# Patient Record
Sex: Female | Born: 1970
Health system: Southern US, Community
[De-identification: ages and names within clinical notes are randomized; demographics above are authoritative.]

## PROBLEM LIST (undated history)

## (undated) DIAGNOSIS — I8290 Acute embolism and thrombosis of unspecified vein: Secondary | ICD-10-CM

## (undated) DIAGNOSIS — J189 Pneumonia, unspecified organism: Secondary | ICD-10-CM

## (undated) DIAGNOSIS — D649 Anemia, unspecified: Secondary | ICD-10-CM

---

## 2013-12-01 ENCOUNTER — Encounter (HOSPITAL_BASED_OUTPATIENT_CLINIC_OR_DEPARTMENT_OTHER): Payer: Self-pay | Admitting: Emergency Medicine

## 2013-12-01 ENCOUNTER — Emergency Department (HOSPITAL_BASED_OUTPATIENT_CLINIC_OR_DEPARTMENT_OTHER)
Admission: EM | Admit: 2013-12-01 | Discharge: 2013-12-01 | Disposition: A | Discharge status: Home / Self Care | Payer: Medicaid Other | Attending: Emergency Medicine | Admitting: Emergency Medicine

## 2013-12-01 ENCOUNTER — Emergency Department (HOSPITAL_BASED_OUTPATIENT_CLINIC_OR_DEPARTMENT_OTHER): Payer: Medicaid Other

## 2013-12-01 DIAGNOSIS — Y92410 Unspecified street and highway as the place of occurrence of the external cause: Secondary | ICD-10-CM | POA: Diagnosis not present

## 2013-12-01 DIAGNOSIS — S59912A Unspecified injury of left forearm, initial encounter: Secondary | ICD-10-CM | POA: Diagnosis not present

## 2013-12-01 DIAGNOSIS — Z862 Personal history of diseases of the blood and blood-forming organs and certain disorders involving the immune mechanism: Secondary | ICD-10-CM | POA: Insufficient documentation

## 2013-12-01 DIAGNOSIS — S46912A Strain of unspecified muscle, fascia and tendon at shoulder and upper arm level, left arm, initial encounter: Secondary | ICD-10-CM | POA: Diagnosis not present

## 2013-12-01 DIAGNOSIS — Y9389 Activity, other specified: Secondary | ICD-10-CM | POA: Diagnosis not present

## 2013-12-01 DIAGNOSIS — S199XXA Unspecified injury of neck, initial encounter: Secondary | ICD-10-CM | POA: Diagnosis present

## 2013-12-01 DIAGNOSIS — Z3202 Encounter for pregnancy test, result negative: Secondary | ICD-10-CM | POA: Insufficient documentation

## 2013-12-01 DIAGNOSIS — S161XXA Strain of muscle, fascia and tendon at neck level, initial encounter: Secondary | ICD-10-CM | POA: Diagnosis not present

## 2013-12-01 DIAGNOSIS — S299XXA Unspecified injury of thorax, initial encounter: Secondary | ICD-10-CM | POA: Diagnosis not present

## 2013-12-01 DIAGNOSIS — S6992XA Unspecified injury of left wrist, hand and finger(s), initial encounter: Secondary | ICD-10-CM | POA: Diagnosis not present

## 2013-12-01 HISTORY — DX: Anemia, unspecified: D64.9

## 2013-12-01 LAB — PREGNANCY, URINE: Preg Test, Ur: NEGATIVE

## 2013-12-01 MED ORDER — TRAMADOL HCL 50 MG PO TABS: 50.0000 mg | ORAL_TABLET | Freq: Four times a day (QID) | ORAL | Status: DC | PRN
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
## 2015-05-05 ENCOUNTER — Emergency Department (HOSPITAL_BASED_OUTPATIENT_CLINIC_OR_DEPARTMENT_OTHER)
Admission: EM | Admit: 2015-05-05 | Discharge: 2015-05-05 | Disposition: A | Discharge status: Home / Self Care | Payer: Medicaid Other | Attending: Emergency Medicine | Admitting: Emergency Medicine

## 2015-05-05 ENCOUNTER — Emergency Department (HOSPITAL_BASED_OUTPATIENT_CLINIC_OR_DEPARTMENT_OTHER): Payer: Medicaid Other

## 2015-05-05 ENCOUNTER — Encounter (HOSPITAL_BASED_OUTPATIENT_CLINIC_OR_DEPARTMENT_OTHER): Payer: Self-pay | Admitting: *Deleted

## 2015-05-05 DIAGNOSIS — Y998 Other external cause status: Secondary | ICD-10-CM | POA: Insufficient documentation

## 2015-05-05 DIAGNOSIS — Z86718 Personal history of other venous thrombosis and embolism: Secondary | ICD-10-CM | POA: Diagnosis not present

## 2015-05-05 DIAGNOSIS — Z862 Personal history of diseases of the blood and blood-forming organs and certain disorders involving the immune mechanism: Secondary | ICD-10-CM | POA: Insufficient documentation

## 2015-05-05 DIAGNOSIS — X58XXXA Exposure to other specified factors, initial encounter: Secondary | ICD-10-CM | POA: Insufficient documentation

## 2015-05-05 DIAGNOSIS — S6992XA Unspecified injury of left wrist, hand and finger(s), initial encounter: Secondary | ICD-10-CM | POA: Insufficient documentation

## 2015-05-05 DIAGNOSIS — Y9389 Activity, other specified: Secondary | ICD-10-CM | POA: Insufficient documentation

## 2015-05-05 DIAGNOSIS — M79604 Pain in right leg: Secondary | ICD-10-CM

## 2015-05-05 DIAGNOSIS — Y9289 Other specified places as the place of occurrence of the external cause: Secondary | ICD-10-CM | POA: Insufficient documentation

## 2015-05-05 DIAGNOSIS — M79645 Pain in left finger(s): Secondary | ICD-10-CM

## 2015-05-05 DIAGNOSIS — S8991XA Unspecified injury of right lower leg, initial encounter: Secondary | ICD-10-CM | POA: Diagnosis not present

## 2015-05-05 HISTORY — DX: Acute embolism and thrombosis of unspecified vein: I82.90

## 2015-05-05 MED ORDER — OXYCODONE-ACETAMINOPHEN 5-325 MG PO TABS: 1.0000 | ORAL_TABLET | Freq: Three times a day (TID) | ORAL | Status: DC | PRN

## 2015-05-05 MED ORDER — OXYCODONE-ACETAMINOPHEN 5-325 MG PO TABS
1.0000 | ORAL_TABLET | Freq: Once | ORAL | Status: AC
  Administered 2015-05-05: 1 via ORAL
  Filled 2015-05-05: qty 1

## 2015-05-05 NOTE — ED Notes (Signed)
NP at bedside.

## 2015-05-05 NOTE — Discharge Instructions (Signed)
Musculoskeletal Pain Musculoskeletal pain is muscle and boney aches and pains. These pains can occur in any part of the body. Your caregiver may treat you without knowing the cause of the pain. They may treat you if blood or urine tests, X-rays, and other tests were normal.  CAUSES There is often not a definite cause or reason for these pains. These pains may be caused by a type of germ (virus). The discomfort may also come from overuse. Overuse includes working out too hard when your body is not fit. Boney aches also come from weather changes. Bone is sensitive to atmospheric pressure changes. HOME CARE INSTRUCTIONS   Ask when your test results will be ready. Make sure you get your test results.  Only take over-the-counter or prescription medicines for pain, discomfort, or fever as directed by your caregiver. If you were given medications for your condition, do not drive, operate machinery or power tools, or sign legal documents for 24 hours. Do not drink alcohol. Do not take sleeping pills or other medications that may interfere with treatment.  Continue all activities unless the activities cause more pain. When the pain lessens, slowly resume normal activities. Gradually increase the intensity and duration of the activities or exercise.  During periods of severe pain, bed rest may be helpful. Lay or sit in any position that is comfortable.  Putting ice on the injured area.  Put ice in a bag.  Place a towel between your skin and the bag.  Leave the ice on for 15 to 20 minutes, 3 to 4 times a day.  Follow up with your caregiver for continued problems and no reason can be found for the pain. If the pain becomes worse or does not go away, it may be necessary to repeat tests or do additional testing. Your caregiver may need to look further for a possible cause. SEEK IMMEDIATE MEDICAL CARE IF:  You have pain that is getting worse and is not relieved by medications.  You develop chest pain  that is associated with shortness or breath, sweating, feeling sick to your stomach (nauseous), or throw up (vomit).  Your pain becomes localized to the abdomen.  You develop any new symptoms that seem different or that concern you. MAKE SURE YOU:   Understand these instructions.  Will watch your condition.  Will get help right away if you are not doing well or get worse.   This information is not intended to replace advice given to you by your health care provider. Make sure you discuss any questions you have with your health care provider.   Document Released: 01/25/2005 Document Revised: 04/19/2011 Document Reviewed: 09/29/2012 Elsevier Interactive Patient Education 2016 [employer] Thumb Sprain A thumb sprain is an injury to one of the strong bands of tissue (ligaments) that connect the bones in your thumb. The ligament can be stretched too much or it can tear. A tear can be either partial or complete. The severity of the sprain depends on how much of the ligament was damaged or torn. CAUSES A thumb sprain is often caused by a fall or an accident. If you extend your hands to catch an object or to protect yourself, the force of the impact can cause your ligament to stretch too much. This excess tension can also cause your ligament to tear. RISK FACTORS This injury is more likely to occur in people who play:  Sports that involve a greater risk of falling, such as skiing.  Sports that involve catching  an object, such as basketball. SYMPTOMS Symptoms of this condition include:  Loss of motion in your thumb.  Bruising.  Tenderness.  Swelling. DIAGNOSIS This condition is diagnosed with a medical history and physical exam. You may also have an X-ray of your thumb. TREATMENT Treatment varies depending on the severity of your sprain. If your ligament is overstretched or partially torn, treatment usually involves keeping your thumb in a fixed position (immobilization) for a  period of time. To help you do this, your health care provider will apply a bandage, cast, or splint to keep your thumb from moving until it heals. If your ligament is fully torn, you may need surgery to reconnect the ligament to the bone. After surgery, a cast or splint will be applied and will need to stay on your thumb while it heals. Your health care provider may also suggest exercises or physical therapy to strengthen your thumb. HOME CARE INSTRUCTIONS If You Have a Cast:  Do not stick anything inside the cast to scratch your skin. Doing that increases your risk of infection.  Check the skin around the cast every day. Report any concerns to your health care provider. You may put lotion on dry skin around the edges of the cast. Do not apply lotion to the skin underneath the cast.  Keep the cast clean and dry. If You Have a Splint:  Wear it as directed by your health care provider. Remove it only as directed by your health care provider.  Loosen the splint if your fingers become numb and tingle, or if they turn cold and blue.  Keep the splint clean and dry. Bathing  Cover the bandage, cast, or splint with a watertight plastic bag to protect it from water while you take a bath or a shower. Do not let the bandage, cast, or splint get wet. Managing Pain, Stiffness, and Swelling   If directed, apply ice to the injured area (unless you have a cast):  Put ice in a plastic bag.  Place a towel between your skin and the bag.  Leave the ice on for 20 minutes, 2-3 times per day.  Move your fingers often to avoid stiffness and to lessen swelling.  Raise (elevate) the injured area above the level of your heart while you are sitting or lying down. Driving  Do not drive or operate heavy machinery while taking pain medicine.  Do not drive while wearing a cast or splint on a hand that you use for driving. General Instructions  Do not put pressure on any part of your cast or splint until it  is fully hardened. This may take several hours.  Take medicines only as directed by your health care provider. These include over-the-counter medicines and prescription medicines.  Keep all follow-up visits as directed by your health care provider. This is important.  Do any exercise or physical therapy as directed by your health care provider.  Do not wear rings on your injured thumb. SEEK MEDICAL CARE IF:  Your pain is not controlled with medicine.  Your bruising or swelling gets worse.  Your cast or splint is damaged. SEEK IMMEDIATE MEDICAL CARE IF:  Your thumb is numb or blue.  Your thumb feels colder than normal.   This information is not intended to replace advice given to you by your health care provider. Make sure you discuss any questions you have with your health care provider.   Document Released: 03/04/2004 Document Revised: 06/11/2014 Document Reviewed: 11/06/2013 Elsevier Interactive Patient  Education ©2016 Elsevier Inc. ° °

## 2015-05-05 NOTE — ED Notes (Signed)
Pain in her right leg and left lower arm since yesterday. States she stopped taking Coumadin 4 months ago for hx of blood clots.

## 2015-05-05 NOTE — ED Notes (Signed)
PA advised pt c/o pain.
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
## 2015-08-08 ENCOUNTER — Emergency Department (HOSPITAL_BASED_OUTPATIENT_CLINIC_OR_DEPARTMENT_OTHER)
Admission: EM | Admit: 2015-08-08 | Discharge: 2015-08-08 | Disposition: A | Discharge status: Home / Self Care | Payer: Medicaid Other | Attending: Emergency Medicine | Admitting: Emergency Medicine

## 2015-08-08 ENCOUNTER — Encounter (HOSPITAL_BASED_OUTPATIENT_CLINIC_OR_DEPARTMENT_OTHER): Payer: Self-pay | Admitting: Emergency Medicine

## 2015-08-08 DIAGNOSIS — K0889 Other specified disorders of teeth and supporting structures: Secondary | ICD-10-CM | POA: Insufficient documentation

## 2015-08-08 DIAGNOSIS — Z79899 Other long term (current) drug therapy: Secondary | ICD-10-CM | POA: Diagnosis not present

## 2015-08-08 MED ORDER — IBUPROFEN 800 MG PO TABS: 800.0000 mg | ORAL_TABLET | Freq: Three times a day (TID) | ORAL | Status: DC | PRN

## 2015-08-08 MED ORDER — CLINDAMYCIN HCL 150 MG PO CAPS: 150.0000 mg | ORAL_CAPSULE | Freq: Four times a day (QID) | ORAL | Status: DC

## 2015-08-08 MED FILL — CLINDAMYCIN HCL 150 MG CAP: 150 | 7 days supply | Qty: 28 | Fill #0

## 2015-08-08 MED FILL — IBUPROFEN 800 MG TABLET: 800 | 10 days supply | Qty: 30 | Fill #0

## 2015-08-08 NOTE — ED Provider Notes (Signed)
CSN: [id number]     Arrival date & time 08/08/15  [id number] History   First MD Initiated Contact with Patient 08/08/15 1013     Chief Complaint  Patient presents with  . Dental Pain     (Consider location/radiation/quality/duration/timing/severity/associated sxs/prior Treatment) HPI   45 year old female presenting with complaint of dental pain. Patient report she had her right upper molar tooth removed recently by a dentist. Patient states they may have cracked the adjacent tooth and since the dental extraction she has been having persistent sharp throbbing pain to her right upper gum as well as pain along her right lower jaw. She expressed the concern of pain to her dentist and was told that "nothing is wrong" she went to a different dentist and was told that she will need to have her "gum shaved". Patient refused to have the procedure done and decided to come to the ER for further evaluation. Patient states that in the past in 2006 she has a bad dental infection "caused by the dentist" and had to be hospitalized therefore she has a strong distrust of dentist. Currently patient denies having fever, trouble swallowing, neck pain. She was prescribed oxycodone 5 days ago by dentist but states that it did not provide any adequate relief of her pain. She is also concerned for infection.  Past Medical History  Diagnosis Date  . Anemia   . Blood clot in vein    Past Surgical History  Procedure Laterality Date  . Cesarean section     No family history on file. Social History  Substance Use Topics  . Smoking status: Never Smoker   . Smokeless tobacco: None  . Alcohol Use: No   OB History    No data available     Review of Systems  All other systems reviewed and are negative.     Allergies  Penicillins and Sulfa antibiotics  Home Medications   Prior to Admission medications   Medication Sig Start Date End Date Taking? Authorizing Provider  oxyCODONE-acetaminophen (PERCOCET) 5-325 MG  tablet Take 1 tablet by mouth every 8 (eight) hours as needed for severe pain. 05/05/15  Yes Felicie Mornavid Smith, NP  traMADol (ULTRAM) 50 MG tablet Take 1 tablet (50 mg total) by mouth every 6 (six) hours as needed. 12/01/13   Geoffery Lyonsouglas Delo, MD   BP 134/81 mmHg  Pulse 71  Temp(Src) 99.1 F (37.3 C) (Oral)  Resp 18  Ht 5\' 1"  (1.549 m)  Wt 84.823 kg  BMI 35.35 kg/m2  SpO2 100%  LMP 07/08/2015 Physical Exam  Constitutional: She appears well-developed. No distress.  HENT:  Mouth: Tenderness noted to right upper gum adjacent to first molar (tooth #1) that was previously extracted. Some evidence of growth noted to the gum area with questionable retained fragment of teeth in the affected region. Tenderness to palpation. No obvious abscess appreciated. Mild gingival erythema.  Neck: Normal range of motion.  Cardiovascular: Normal rate and regular rhythm.   Pulmonary/Chest: Effort normal and breath sounds normal.  Lymphadenopathy:    She has no cervical adenopathy.  Neurological: She is alert.  Skin: Skin is warm.  Psychiatric: She has a normal mood and affect.  Nursing note and vitals reviewed.   ED Course  Procedures (including critical care time)   MDM   Final diagnoses:  Pain, dental    BP 134/81 mmHg  Pulse 71  Temp(Src) 99.1 F (37.3 C) (Oral)  Resp 18  Ht 5\' 1"  (1.549 m)  Wt 84.823 kg  BMI 35.35 kg/m2  SpO2 100%  LMP 07/08/2015   10:19 AM Pt here with dental pain.  She was seen by Dr. Gay FillerStefan DDS 5 days ago, and was prescribed 20 vicodin according [facility].    Pt has pain to the gum line.  No obvious abscess.  Will provide abx, NSAIDs and dental referral.  Low suspicion for deep space infection.    Fayrene HelperBowie Jennavieve Arrick, PA-C 08/08/15 1036  Laurence Spatesachel Morgan Little, MD 08/08/15 [phone]

## 2015-08-08 NOTE — ED Notes (Signed)
R upper broken tooth causing pain x 1 week. Pt dentist prescribed hydrocodone and pt states pain persists.

## 2015-08-08 NOTE — Discharge Instructions (Signed)
Dental Pain °Dental pain may be caused by many things, including: °· Tooth decay (cavities or caries). Cavities cause the nerve of your tooth to be open to air and hot or cold temperatures. This can cause pain or discomfort. °· Abscess or infection. A dental abscess is an area that is full of infected pus from a bacterial infection in the inner part of the tooth (pulp). It usually happens at the end of the tooth's root. °· Injury. °· An unknown reason (idiopathic). °Your pain may be mild or severe. It may only happen when: °· You are chewing. °· You are exposed to hot or cold temperature. °· You are eating or drinking sugary foods or beverages, such as: °¨ Soda. °¨ Candy. °Your pain may also be there all of the time. °HOME CARE °Watch your dental pain for any changes. Do these things to lessen your discomfort: °· Take medicines only as told by your dentist. °· If your dentist tells you to take an antibiotic medicine, finish all of it even if you start to feel better. °· Keep all follow-up visits as told by your dentist. This is important. °· Do not apply heat to the outside of your face. °· Rinse your mouth or gargle with salt water if told by your dentist. This helps with pain and swelling. °¨ You can make salt water by adding ¼ tsp of salt to 1 cup of warm water. °· Apply ice to the painful area of your face: °¨ Put ice in a plastic bag. °¨ Place a towel between your skin and the bag. °¨ Leave the ice on for 20 minutes, 2-3 times per day. °· Avoid foods or drinks that cause you pain, such as: °¨ Very hot or very cold foods or drinks. °¨ Sweet or sugary foods or drinks. °GET HELP IF: °· Your pain is not helped with medicines. °· Your symptoms are worse. °· You have new symptoms. °GET HELP RIGHT AWAY IF: °· You cannot open your mouth. °· You are having trouble breathing or swallowing. °· You have a fever. °· Your face, neck, or jaw is puffy (swollen). °  °This information is not intended to replace advice given to  you by your health care provider. Make sure you discuss any questions you have with your health care provider. °  °Document Released: 07/14/2007 Document Revised: 06/11/2014 Document Reviewed: 01/21/2014 °Elsevier Interactive Patient Education ©2016 Elsevier Inc. ° ° ° °Community Resource Guide Dental °The United Way’s “211” is a great source of information about community services available.  Access by dialing 2-1-1 from anywhere in [place], or by website -  www.nc211.org.  ° °Other Local Resources (Updated 02/2015) ° °Dental  Care °  °Services ° °  °Phone Number and Address  °Cost  °[facility] For children 0 - 21 years of age:  °• Cleaning °• Tooth brushing/flossing instruction °• Sealants, fillings, crowns °• Extractions °• Emergency treatment  [phone] °[address] °[place], [place] [address] Charges based on family income.  Medicaid and some insurance plans accepted.   °  °[facility] • Cleaning °• Sealants, fillings, crowns °• Extractions °• Emergency treatment [phone] °[address] °[place], [place] ° Pregnant women 18 years of age or older with a Medicaid card  °[facility] [place] • Cleaning °• Sealants, fillings, crowns °• Extractions °• Emergency treatment [phone] °[address] °[place],  Pregnant women 18   years of age or older with a Medicaid card  °[facility] For children 0 - 21 years of age:  °• Cleaning °• Tooth brushing/flossing instruction °• Sealants, fillings, crowns °• Extractions °• Emergency treatment °Limited orthodontic services for patients with Medicaid [phone] °[address] °[place], [place] [address] Medicaid and [place] Health Choice cover for children up to age 21 and pregnant women.  Parents of children up to age 21 without Medicaid pay a reduced fee at time of service.   °[facility] [place] For children 0 - 21 years of age:  °• Cleaning °• Tooth brushing/flossing instruction °• Sealants, fillings, crowns °• Extractions °• Emergency treatment °Limited orthodontic services for patients with Medicaid [phone] °[address] °[place], [place].  Medicaid and [place] Health Choice cover for children up to age 21 and pregnant women.  Parents of children up to age 21 without Medicaid pay a reduced fee.  °[facility] • Cleaning °• Sealants, fillings, crowns °• Extractions ° °Hours: Tuesdays and Thursdays, 4:15 - 8 pm [phone] °[address] °[place], [place] [address] Services free of charge to [place] residents ages 18-64 who do not have health insurance, Medicare, Medicaid, or VA benefits and fall within federal poverty guidelines  °[facility] ° ° ° Provides dental care in addition to primary medical care, nutritional counseling, and pharmacy: °• Cleaning °• Sealants, fillings, crowns °• Extractions ° ° ° ° ° ° ° ° ° ° ° ° ° ° ° ° ° [phone] °[facility], [address] °[place], [place] ° °[phone] °[facility], [address] [place], [place] ° °[phone] °[facility] °[place], [place] ° °[phone] °[facility], [address] °[place], [place] ° °[phone] °[facility] °[address] °[place], [place] Accepts Medicaid, Medicare, most insurance.  Also provides services available to all with fees adjusted based on ability to pay.    °[facility] • Cleaning °• Tooth brushing/flossing instruction °• Sealants, fillings, crowns °• Extractions °• Emergency treatment °Hours: Tuesdays, Thursdays, and Fridays from 8 am to 5 pm by appointment only. [phone] °[address] °[place], [place] [address] [place] residents  with Medicaid (depending on eligibility) and children with [place] Health Choice - call for more information.  °[facility] • Extractions only ° °Hours: 2nd and 4th Thursday of each month from 6:30 am - 9 am.   [phone] ext. [phone] °[address] °[place], [place] [address] Ages 18 and older only.  Patients are seen on a first come, first served basis.  °[facility] • Cleanings °• Fillings °• Extractions °• Orthodontics °• Endodontics °• Implants/Crowns/Bridges °• Complete and partial dentures [phone] °[place],  Patients must complete an application for services.  There is often a waiting list.   ° °

## 2015-11-26 ENCOUNTER — Encounter (HOSPITAL_BASED_OUTPATIENT_CLINIC_OR_DEPARTMENT_OTHER): Payer: Self-pay | Admitting: *Deleted

## 2015-11-26 ENCOUNTER — Emergency Department (HOSPITAL_BASED_OUTPATIENT_CLINIC_OR_DEPARTMENT_OTHER): Payer: Medicaid Other

## 2015-11-26 ENCOUNTER — Emergency Department (HOSPITAL_BASED_OUTPATIENT_CLINIC_OR_DEPARTMENT_OTHER)
Admission: EM | Admit: 2015-11-26 | Discharge: 2015-11-26 | Disposition: A | Discharge status: Home / Self Care | Payer: Medicaid Other | Attending: Emergency Medicine | Admitting: Emergency Medicine

## 2015-11-26 DIAGNOSIS — M654 Radial styloid tenosynovitis [de Quervain]: Secondary | ICD-10-CM | POA: Diagnosis not present

## 2015-11-26 DIAGNOSIS — M778 Other enthesopathies, not elsewhere classified: Secondary | ICD-10-CM

## 2015-11-26 DIAGNOSIS — M779 Enthesopathy, unspecified: Secondary | ICD-10-CM

## 2015-11-26 DIAGNOSIS — M79642 Pain in left hand: Secondary | ICD-10-CM | POA: Diagnosis present

## 2015-11-26 MED ORDER — MELOXICAM 15 MG PO TABS: 15.0000 mg | ORAL_TABLET | Freq: Every day | ORAL | 0 refills | Status: DC

## 2015-11-26 MED FILL — MELOXICAM 15 MG TABLET: 15 | 30 days supply | Qty: 30 | Fill #0

## 2015-11-26 NOTE — Discharge Instructions (Signed)
Wear splint at work.  Mobic daily.  Ice to sore area for 20 minutes after work.
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
## 2015-11-26 NOTE — ED Notes (Signed)
Patient is alert and oriented x3.  She was given DC instructions and follow up visit instructions.  Patient gave verbal understanding. She was DC ambulatory under her own power to home.  V/S stable.  He was not showing any signs of distress on DC 

## 2015-11-26 NOTE — ED Notes (Signed)
Given d/c instructions as per chart. Verbalizes understanding. No questions. Rx x 1 

## 2015-11-26 NOTE — ED Triage Notes (Signed)
Pt c/o left hand /wrist pain with out injury

## 2016-01-05 ENCOUNTER — Other Ambulatory Visit (HOSPITAL_BASED_OUTPATIENT_CLINIC_OR_DEPARTMENT_OTHER): Payer: Self-pay | Admitting: Emergency Medicine

## 2016-01-05 DIAGNOSIS — R609 Edema, unspecified: Secondary | ICD-10-CM

## 2016-01-06 ENCOUNTER — Ambulatory Visit (HOSPITAL_BASED_OUTPATIENT_CLINIC_OR_DEPARTMENT_OTHER): Admission: RE | Admit: 2016-01-06 | Payer: Medicaid Other | Source: Ambulatory Visit

## 2016-04-08 ENCOUNTER — Emergency Department (HOSPITAL_BASED_OUTPATIENT_CLINIC_OR_DEPARTMENT_OTHER)
Admission: EM | Admit: 2016-04-08 | Discharge: 2016-04-08 | Disposition: A | Discharge status: Home / Self Care | Payer: Medicaid Other | Attending: Emergency Medicine | Admitting: Emergency Medicine

## 2016-04-08 ENCOUNTER — Emergency Department (HOSPITAL_BASED_OUTPATIENT_CLINIC_OR_DEPARTMENT_OTHER): Payer: Medicaid Other

## 2016-04-08 ENCOUNTER — Encounter (HOSPITAL_BASED_OUTPATIENT_CLINIC_OR_DEPARTMENT_OTHER): Payer: Self-pay | Admitting: Emergency Medicine

## 2016-04-08 DIAGNOSIS — W172XXA Fall into hole, initial encounter: Secondary | ICD-10-CM | POA: Diagnosis not present

## 2016-04-08 DIAGNOSIS — Y92009 Unspecified place in unspecified non-institutional (private) residence as the place of occurrence of the external cause: Secondary | ICD-10-CM | POA: Diagnosis not present

## 2016-04-08 DIAGNOSIS — Y939 Activity, unspecified: Secondary | ICD-10-CM | POA: Diagnosis not present

## 2016-04-08 DIAGNOSIS — Z3A09 9 weeks gestation of pregnancy: Secondary | ICD-10-CM | POA: Insufficient documentation

## 2016-04-08 DIAGNOSIS — S7002XA Contusion of left hip, initial encounter: Secondary | ICD-10-CM | POA: Diagnosis not present

## 2016-04-08 DIAGNOSIS — Y999 Unspecified external cause status: Secondary | ICD-10-CM | POA: Diagnosis not present

## 2016-04-08 DIAGNOSIS — S8392XA Sprain of unspecified site of left knee, initial encounter: Secondary | ICD-10-CM | POA: Diagnosis not present

## 2016-04-08 DIAGNOSIS — W19XXXA Unspecified fall, initial encounter: Secondary | ICD-10-CM

## 2016-04-08 DIAGNOSIS — O9A211 Injury, poisoning and certain other consequences of external causes complicating pregnancy, first trimester: Secondary | ICD-10-CM | POA: Insufficient documentation

## 2016-04-08 MED ORDER — HYDROCODONE-ACETAMINOPHEN 5-325 MG PO TABS
1.0000 | ORAL_TABLET | Freq: Once | ORAL | Status: AC
  Administered 2016-04-08: 1 via ORAL
  Filled 2016-04-08: qty 1

## 2016-04-08 NOTE — Discharge Instructions (Signed)
I discussed x-ray findings with the. Please rest, ice, elevate your leg. Use crutches as needed. Weight-bear as tolerated. Use a knee sleeve for comfort. Please follow-up with orthopedist if symptoms not improved. Return to ED for symptoms worsen.

## 2016-04-08 NOTE — ED Notes (Signed)
Pt offered ibuprofen but declined.  

## 2016-04-08 NOTE — ED Provider Notes (Signed)
Patient received and care hand off from PA Kirichenko. Please see her full note for history and physical. Discharge pending knee x-ray. X-ray of her knee came back without any acute abnormalities. Discussed findings with patient. Patient was given crutches. She is pregnant so only Tylenol for pain. Encouraged rest, ice, elevation of the leg. Have given orthopedic follow-up. She is agreeable the above plan. Pain slightly improved in the ED. Has asked for 3 day work excuse note. Given strict return precautions.   Rise MuKenneth T Gianni Mihalik, PA-C 04/08/16 1724    Canary Brimhristopher J Tegeler, MD 04/09/16 [phone]
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
## 2016-04-08 NOTE — ED Notes (Signed)
Pt signed consent to have hip XR.  Stated that her doctor knew about it.  Pt stated to nurse that her pmd had sent her to the ED for an XR of her hip since he could not do it.  PA spoke with pt about the xr and pt was fine with having the XR.

## 2016-08-16 ENCOUNTER — Inpatient Hospital Stay (HOSPITAL_COMMUNITY)
Admission: AD | Admit: 2016-08-16 | Discharge: 2016-08-17 | Disposition: A | Discharge status: Home / Self Care | Payer: Medicaid Other | Source: Ambulatory Visit | Attending: Family Medicine | Admitting: Family Medicine

## 2016-08-16 DIAGNOSIS — R42 Dizziness and giddiness: Secondary | ICD-10-CM | POA: Insufficient documentation

## 2016-08-16 LAB — URINALYSIS, ROUTINE W REFLEX MICROSCOPIC
BILIRUBIN URINE: NEGATIVE
GLUCOSE, UA: NEGATIVE mg/dL
KETONES UR: NEGATIVE mg/dL
LEUKOCYTES UA: NEGATIVE
NITRITE: NEGATIVE
PH: 5 (ref 5.0–8.0)
Protein, ur: NEGATIVE mg/dL
Specific Gravity, Urine: 1.024 (ref 1.005–1.030)
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
## 2016-08-17 NOTE — MAU Note (Signed)
NOT IN LOBBY 

## 2016-12-05 ENCOUNTER — Emergency Department (HOSPITAL_BASED_OUTPATIENT_CLINIC_OR_DEPARTMENT_OTHER): Payer: Self-pay

## 2016-12-05 ENCOUNTER — Encounter (HOSPITAL_BASED_OUTPATIENT_CLINIC_OR_DEPARTMENT_OTHER): Payer: Self-pay | Admitting: Emergency Medicine

## 2016-12-05 ENCOUNTER — Emergency Department (HOSPITAL_BASED_OUTPATIENT_CLINIC_OR_DEPARTMENT_OTHER)
Admission: EM | Admit: 2016-12-05 | Discharge: 2016-12-05 | Discharge status: Against Medical Advice | Payer: Self-pay | Attending: Emergency Medicine | Admitting: Emergency Medicine

## 2016-12-05 DIAGNOSIS — R42 Dizziness and giddiness: Secondary | ICD-10-CM

## 2016-12-05 DIAGNOSIS — M25512 Pain in left shoulder: Secondary | ICD-10-CM

## 2016-12-05 DIAGNOSIS — R51 Headache: Secondary | ICD-10-CM | POA: Insufficient documentation

## 2016-12-05 DIAGNOSIS — R519 Headache, unspecified: Secondary | ICD-10-CM

## 2016-12-05 DIAGNOSIS — Z79899 Other long term (current) drug therapy: Secondary | ICD-10-CM | POA: Insufficient documentation

## 2016-12-05 LAB — CBC
HCT: 34.2 % — ABNORMAL LOW (ref 36.0–46.0)
Hemoglobin: 11.4 g/dL — ABNORMAL LOW (ref 12.0–15.0)
MCH: 27.5 pg (ref 26.0–34.0)
MCHC: 33.3 g/dL (ref 30.0–36.0)
MCV: 82.6 fL (ref 78.0–100.0)
Platelets: 294 10*3/uL (ref 150–400)
RBC: 4.14 MIL/uL (ref 3.87–5.11)
RDW: 12.7 % (ref 11.5–15.5)
WBC: 5.7 10*3/uL (ref 4.0–10.5)

## 2016-12-05 LAB — PREGNANCY, URINE: PREG TEST UR: NEGATIVE

## 2016-12-05 LAB — URINALYSIS, MICROSCOPIC (REFLEX)

## 2016-12-05 LAB — URINALYSIS, ROUTINE W REFLEX MICROSCOPIC
Bilirubin Urine: NEGATIVE
Glucose, UA: NEGATIVE mg/dL
KETONES UR: NEGATIVE mg/dL
LEUKOCYTES UA: NEGATIVE
Nitrite: NEGATIVE
Protein, ur: NEGATIVE mg/dL
Specific Gravity, Urine: 1.025 (ref 1.005–1.030)
pH: 6 (ref 5.0–8.0)

## 2016-12-05 LAB — BASIC METABOLIC PANEL
Anion gap: 6 (ref 5–15)
BUN: 13 mg/dL (ref 6–20)
CO2: 27 mmol/L (ref 22–32)
Calcium: 8.9 mg/dL (ref 8.9–10.3)
Chloride: 106 mmol/L (ref 101–111)
Creatinine, Ser: 0.75 mg/dL (ref 0.44–1.00)
GFR calc non Af Amer: >60 mL/min (ref >60)
Glucose, Bld: 93 mg/dL (ref 65–99)
Potassium: 3.7 mmol/L (ref 3.5–5.1)
Sodium: 139 mmol/L (ref 135–145)

## 2016-12-05 MED ORDER — NAPROXEN 500 MG PO TABS: 500.0000 mg | ORAL_TABLET | Freq: Two times a day (BID) | ORAL | 0 refills | Status: DC

## 2016-12-05 MED ORDER — KETOROLAC TROMETHAMINE 15 MG/ML IJ SOLN: 15.0000 mg | Freq: Once | INTRAMUSCULAR | Status: DC

## 2016-12-05 MED ORDER — MECLIZINE HCL 12.5 MG PO TABS
12.5000 mg | ORAL_TABLET | Freq: Three times a day (TID) | ORAL | 0 refills | Status: DC | PRN
Start: 2016-12-05 — End: 2019-03-11

## 2016-12-05 MED ORDER — NAPROXEN 250 MG PO TABS
500.0000 mg | ORAL_TABLET | Freq: Once | ORAL | Status: AC
  Administered 2016-12-05: 500 mg via ORAL
  Filled 2016-12-05: qty 2

## 2016-12-05 MED ORDER — SODIUM CHLORIDE 0.9 % IV BOLUS (SEPSIS): 1000.0000 mL | Freq: Once | INTRAVENOUS | Status: DC

## 2016-12-05 MED ORDER — DIPHENHYDRAMINE HCL 50 MG/ML IJ SOLN: 25.0000 mg | Freq: Once | INTRAMUSCULAR | Status: DC

## 2016-12-05 MED ORDER — PROCHLORPERAZINE EDISYLATE 5 MG/ML IJ SOLN: 10.0000 mg | Freq: Once | INTRAMUSCULAR | Status: DC

## 2016-12-05 NOTE — Discharge Instructions (Signed)
The x-ray of your left shoulder did not show any fracture.  You likely have a soft tissue injury.  You may wear the sling we have given to him in the ER and use this for support for the next several days.  Please try to move the left shoulder as tolerated to prevent frozen shoulder.  I have written you a prescription for a medicine called Naprosyn.  Please take this twice a day for headache.   I have also written you a prescription for a medicine called meclizine.  This can help with the dizziness.  I have written you a work note.  Please schedule an appointment with your primary doctor, as you had red blood cells in the urine.  These were also present in your lab work 3 months ago.  You may need to see a urologist, please discuss this with your primary doctor.  Return to the emergency department if you have dizziness which makes it difficult to walk, worsening headache with fever, headache with nausea/vomiting that does not stop, headache with numbness or weakness.

## 2016-12-05 NOTE — ED Triage Notes (Signed)
Dizziness and headaches x 3 days. States she feels like her BP is high.

## 2016-12-05 NOTE — ED Notes (Signed)
Entered room to start IV and administer medications, pt voiced not wanting the IV or the "work-up". Pt states, "my ride is outside and they will leave me". This RN explained the importance of the treatment plan. MD notified and returned to room to discuss plan of care with patient.
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
## 2016-12-05 NOTE — ED Notes (Addendum)
EMT reported that the patient was leaving and did not want to stay for treatment.

## 2016-12-05 NOTE — ED Notes (Signed)
PMS intact before and after. Pt tolerated well. All questions answered. 

## 2016-12-05 NOTE — ED Notes (Signed)
Per PA pt refused to have any work done, but will accept the sling.

## 2016-12-05 NOTE — ED Notes (Signed)
Pt now mentions she fell 1 week ago and has L shoulder pain. Unable to fully lift it up.

## 2018-12-20 ENCOUNTER — Emergency Department (HOSPITAL_BASED_OUTPATIENT_CLINIC_OR_DEPARTMENT_OTHER)
Admission: EM | Admit: 2018-12-20 | Discharge: 2018-12-20 | Disposition: A | Discharge status: Home / Self Care | Payer: Medicaid Other | Attending: Emergency Medicine | Admitting: Emergency Medicine

## 2018-12-20 ENCOUNTER — Emergency Department (HOSPITAL_BASED_OUTPATIENT_CLINIC_OR_DEPARTMENT_OTHER): Payer: Medicaid Other

## 2018-12-20 ENCOUNTER — Encounter (HOSPITAL_BASED_OUTPATIENT_CLINIC_OR_DEPARTMENT_OTHER): Payer: Self-pay | Admitting: *Deleted

## 2018-12-20 ENCOUNTER — Other Ambulatory Visit: Payer: Self-pay

## 2018-12-20 DIAGNOSIS — Z79899 Other long term (current) drug therapy: Secondary | ICD-10-CM | POA: Diagnosis not present

## 2018-12-20 DIAGNOSIS — U071 COVID-19: Secondary | ICD-10-CM | POA: Diagnosis not present

## 2018-12-20 DIAGNOSIS — R0602 Shortness of breath: Secondary | ICD-10-CM | POA: Diagnosis not present

## 2018-12-20 DIAGNOSIS — J1289 Other viral pneumonia: Secondary | ICD-10-CM | POA: Diagnosis not present

## 2018-12-20 DIAGNOSIS — J45909 Unspecified asthma, uncomplicated: Secondary | ICD-10-CM | POA: Insufficient documentation

## 2018-12-20 DIAGNOSIS — J1282 Pneumonia due to coronavirus disease 2019: Secondary | ICD-10-CM

## 2018-12-20 HISTORY — DX: Pneumonia, unspecified organism: J18.9

## 2018-12-20 LAB — CBC WITH DIFFERENTIAL/PLATELET
Abs Immature Granulocytes: 0.01 10*3/uL (ref 0.00–0.07)
Basophils Absolute: 0 10*3/uL (ref 0.0–0.1)
Basophils Relative: 0 %
Eosinophils Absolute: 0 10*3/uL (ref 0.0–0.5)
Eosinophils Relative: 0 %
HCT: 36 % (ref 36.0–46.0)
Hemoglobin: 11.5 g/dL — ABNORMAL LOW (ref 12.0–15.0)
Immature Granulocytes: 0 %
Lymphocytes Relative: 48 %
Lymphs Abs: 2 10*3/uL (ref 0.7–4.0)
MCH: 26.4 pg (ref 26.0–34.0)
MCHC: 31.9 g/dL (ref 30.0–36.0)
MCV: 82.6 fL (ref 80.0–100.0)
Monocytes Absolute: 0.4 10*3/uL (ref 0.1–1.0)
Monocytes Relative: 10 %
Neutro Abs: 1.8 10*3/uL (ref 1.7–7.7)
Neutrophils Relative %: 42 %
Platelets: 246 10*3/uL (ref 150–400)
RBC: 4.36 MIL/uL (ref 3.87–5.11)
RDW: 12.8 % (ref 11.5–15.5)
WBC: 4.2 10*3/uL (ref 4.0–10.5)
nRBC: 0 % (ref 0.0–0.2)

## 2018-12-20 LAB — LACTIC ACID, PLASMA: Lactic Acid, Venous: 1.7 mmol/L (ref 0.5–1.9)

## 2018-12-20 LAB — COMPREHENSIVE METABOLIC PANEL
ALT: 11 U/L (ref 0–44)
AST: 21 U/L (ref 15–41)
Albumin: 3.6 g/dL (ref 3.5–5.0)
Alkaline Phosphatase: 69 U/L (ref 38–126)
Anion gap: 10 (ref 5–15)
BUN: 11 mg/dL (ref 6–20)
CO2: 25 mmol/L (ref 22–32)
Calcium: 8.4 mg/dL — ABNORMAL LOW (ref 8.9–10.3)
Chloride: 100 mmol/L (ref 98–111)
Creatinine, Ser: 0.73 mg/dL (ref 0.44–1.00)
GFR calc Af Amer: >60 mL/min (ref >60)
GFR calc non Af Amer: >60 mL/min (ref >60)
Glucose, Bld: 128 mg/dL — ABNORMAL HIGH (ref 70–99)
Potassium: 3.1 mmol/L — ABNORMAL LOW (ref 3.5–5.1)
Sodium: 135 mmol/L (ref 135–145)
Total Bilirubin: 0.4 mg/dL (ref 0.3–1.2)
Total Protein: 8.4 g/dL — ABNORMAL HIGH (ref 6.5–8.1)

## 2018-12-20 LAB — TROPONIN I (HIGH SENSITIVITY)
Troponin I (High Sensitivity): <2 ng/L (ref <18)
Troponin I (High Sensitivity): <2 ng/L (ref <18)

## 2018-12-20 MED ORDER — ACETAMINOPHEN 325 MG PO TABS
650.0000 mg | ORAL_TABLET | Freq: Once | ORAL | Status: AC
  Administered 2018-12-20: 18:00:00 650 mg via ORAL
  Filled 2018-12-20: qty 2

## 2018-12-20 MED ORDER — DEXAMETHASONE SODIUM PHOSPHATE 10 MG/ML IJ SOLN
10.0000 mg | Freq: Once | INTRAMUSCULAR | Status: AC
  Administered 2018-12-20: 10 mg via INTRAVENOUS
  Filled 2018-12-20: qty 1

## 2018-12-20 MED ORDER — ALBUTEROL SULFATE HFA 108 (90 BASE) MCG/ACT IN AERS
8.0000 | INHALATION_SPRAY | Freq: Once | RESPIRATORY_TRACT | Status: AC
  Administered 2018-12-20: 18:00:00 8 via RESPIRATORY_TRACT
  Filled 2018-12-20: qty 6.7

## 2018-12-20 MED ORDER — AEROCHAMBER PLUS FLO-VU MEDIUM MISC
1.0000 | Freq: Once | Status: AC
  Administered 2018-12-20: 18:00:00 1
  Filled 2018-12-20: qty 1

## 2018-12-20 MED ORDER — ALBUTEROL SULFATE HFA 108 (90 BASE) MCG/ACT IN AERS: 1.0000 | INHALATION_SPRAY | Freq: Four times a day (QID) | RESPIRATORY_TRACT | 0 refills | Status: DC | PRN

## 2018-12-20 MED ORDER — PREDNISONE 10 MG (21) PO TBPK: ORAL_TABLET | Freq: Every day | ORAL | 0 refills | Status: DC

## 2018-12-20 NOTE — ED Provider Notes (Signed)
[facility] EMERGENCY DEPARTMENT Provider Note   CSN: [id number] Arrival date & time: 12/20/18  1519     History   Chief Complaint Chief Complaint  Patient presents with  . Shortness of Breath    HPI Michele Clayton is a 48 y.o. female with history of G6PD deficiency, left DVT s/p Coumadin and Lovenox, asthma presents to the ER for evaluation of shortness of breath associated with chest pain.  Patient was laying down in bed last night when she noticed the chest pain.  It is located to the left breast, sharp.  It is worse with taking deep breaths, coughing and moving.  Has associated pain in the thoracic back and lungs, shortness of breath that is worse when she lays flat and moves around too quickly.  Patient was seen at [facility] ER on 11/6 and was tested for COVID-19.  She is confused about her diagnosis because she read on her MyChart COVID was "detected" but then somebody called her and said she had pneumonia instead.  Her symptoms began around 11/4 including chills, shakes, fevers, headache, cough with clear phlegm.  She was given Levaquin at [facility] ER and had vomiting afterwards but this is stopped.  She had diarrhea for couple of days after the ER visit but she attributes this to the antibiotics that she has been taking.  She was discharged with Levaquin and feels like her symptoms are not any better.  Has h/o asthma and used albuterol inhaler at noon and had slight temporary improvement of her shortness of breath.  She does not have a nebulizing machine anymore.  She is supposed to be on iron but not taking it.    No congestion, nausea, vomiting, abdominal pain, dysuria.  No fever.     HPI  Past Medical History:  Diagnosis Date  . Anemia   . Blood clot in vein   . Pneumonia     There are no active problems to display for this patient.   Past Surgical History:  Procedure Laterality Date  . CESAREAN SECTION       OB History   No obstetric history on file.       Home Medications    Prior to Admission medications   Medication Sig Start Date End Date Taking? Authorizing Provider  acetaminophen (TYLENOL) 500 MG tablet Take 1,000 mg by mouth every 6 (six) hours as needed.   Yes [provider]  meclizine (ANTIVERT) 12.5 MG tablet Take 1 tablet (12.5 mg total) by mouth 3 (three) times daily as needed for dizziness. 12/05/16   Kellie Shropshire, PA-C  meloxicam (MOBIC) 15 MG tablet Take 1 tablet (15 mg total) by mouth daily. 11/26/15   Rolland Porter, MD  naproxen (NAPROSYN) 500 MG tablet Take 1 tablet (500 mg total) by mouth 2 (two) times daily. 12/05/16   Kellie Shropshire, PA-C    Family History History reviewed. No pertinent family history.  Social History Social History   Tobacco Use  . Smoking status: Never Smoker  . Smokeless tobacco: Never Used  Substance Use Topics  . Alcohol use: No  . Drug use: No     Allergies   Penicillins and Sulfa antibiotics   Review of Systems Review of Systems  Constitutional: Positive for chills.  Respiratory: Positive for cough and shortness of breath.   Gastrointestinal: Positive for diarrhea (resolved) and vomiting (resolved).  Neurological: Positive for headaches.  All other systems reviewed and are negative.    Physical  Exam Updated Vital Signs BP 108/80   Pulse (!) 105   Temp 98.7 F (37.1 C) (Oral)   Resp (!) 31   SpO2 96%   Physical Exam Vitals signs and nursing note reviewed.  Constitutional:      Appearance: She is well-developed.     Comments: Non toxic in NAD  HENT:     Head: Normocephalic and atraumatic.     Nose: Nose normal.  Eyes:     Conjunctiva/sclera: Conjunctivae normal.  Neck:     Musculoskeletal: Normal range of motion.  Cardiovascular:     Rate and Rhythm: Regular rhythm. Tachycardia present.     Comments: HR 100-108 during encounter and on monitor.  No LE edema. No calf asymmetry or tenderness.  Pulmonary:     Effort: Pulmonary effort is  normal.     Breath sounds: Normal breath sounds.     Comments: Diminished air sounds in lower lobes, limited exam due to body habitus.  No wheezing, crackles. Speaking in full sentences during exam. SpO2 >95% on RA.   Abdominal:     General: Bowel sounds are normal.     Palpations: Abdomen is soft.     Tenderness: There is no abdominal tenderness.     Comments: No G/R/R. No suprapubic or CVA tenderness. Negative Murphy's and McBurney's. Active BS to lower quadrants.   Musculoskeletal: Normal range of motion.  Skin:    General: Skin is warm and dry.     Capillary Refill: Capillary refill takes less than 2 seconds.  Neurological:     Mental Status: She is alert.  Psychiatric:        Behavior: Behavior normal.      ED Treatments / Results  Labs (all labs ordered are listed, but only abnormal results are displayed) Labs Reviewed  CBC WITH DIFFERENTIAL/PLATELET - Abnormal; Notable for the following components:      Result Value   Hemoglobin 11.5 (*)    All other components within normal limits  COMPREHENSIVE METABOLIC PANEL - Abnormal; Notable for the following components:   Potassium 3.1 (*)    Glucose, Bld 128 (*)    Calcium 8.4 (*)    Total Protein 8.4 (*)    All other components within normal limits  LACTIC ACID, PLASMA  LACTIC ACID, PLASMA  TROPONIN I (HIGH SENSITIVITY)  TROPONIN I (HIGH SENSITIVITY)    EKG EKG Interpretation  Date/Time:  Wednesday December 20 2018 15:53:52 EST Ventricular Rate:  103 PR Interval:    QRS Duration: 72 QT Interval:  372 QTC Calculation: 487 R Axis:   46 Text Interpretation: Sinus tachycardia Ventricular premature complex Probable left atrial enlargement Confirmed by Lennice Sites [phone]) on 12/20/2018 4:08:59 PM   Radiology Dg Chest Port 1 View  Result Date: 12/20/2018 CLINICAL DATA:  COVID-19, shortness of breath, left chest pain EXAM: PORTABLE CHEST 1 VIEW COMPARISON:  10/12/2018, CT chest, 12/15/2018 FINDINGS: Mild  cardiomegaly. Bibasilar predominant ill-defined heterogeneous airspace opacities, which are new when compared to chest radiographs dated 10/12/2018 and appear increased compared to CT dated 12/15/2018. The visualized skeletal structures are unremarkable. IMPRESSION: 1. Bibasilar predominant ill-defined heterogeneous airspace opacities, which are new when compared to chest radiographs dated 10/12/2018 and appear increased compared to CT dated 12/15/2018. Findings are generally consistent with reported COVID-19 diagnosis. 2.  Mild cardiomegaly. Electronically Signed   By: Eddie Candle M.D.   On: 12/20/2018 17:11    Procedures Procedures (including critical care time)  Medications Ordered in ED Medications  dexamethasone (  DECADRON) injection 10 mg (10 mg Intravenous Given 12/20/18 1744)  albuterol (VENTOLIN HFA) 108 (90 Base) MCG/ACT inhaler 8 puff (8 puffs Inhalation Given 12/20/18 1749)  AeroChamber Plus Flo-Vu Medium MISC 1 each (1 each Other Given 12/20/18 1749)  acetaminophen (TYLENOL) tablet 650 mg (650 mg Oral Given 12/20/18 1742)     Initial Impression / Assessment and Plan / ED Course  I have reviewed the triage vital signs and the nursing notes.  Pertinent labs & imaging results that were available during my care of the patient were reviewed by me and considered in my medical decision making (see chart for details).  Clinical Course as of Dec 19 1917  Wed Dec 20, 2018  1727 Potassium(!): 3.1 [CG]  1728 1. Bibasilar predominant ill-defined heterogeneous airspace opacities, which are new when compared to chest radiographs dated 10/12/2018 and appear increased compared to CT dated 12/15/2018. Findings are generally consistent with reported COVID-19 diagnosis  DG Chest [facility] [CG]    Clinical Course User Index [CG] Liberty Handy, PA-C   EMR reviewed.  [place] ER visit reviewed.  She presented for myalgias, cough, chills.  She had extensive work-up including CT angio of  the chest which was negative for PE but revealed bilateral opacities consistent with atypical infection.  Incidental thyroid nodule with normal thyroid panel with pending outpatient follow-up.  Negative for influenza A/B.  Positive COVID-19.  She was discharged with azithromycin and Vantin.  Her heart rate was 104-114 during ambulation to the 100% SPO2.  She presents today for shortness of breath, left-sided chest pain and lingering other symptoms.  History of asthma. Patient ambulated for 3 minutes in the ER with increasing heart rate in the100s, with tachypnea but SPO2 maintained at 92 to 94%. HR response during ambulation similar to ambulation trial at [facility] ER 11/6.   Exam is benign other than mild tachycardia.  No signs of respiratory distress.  No hypervolemia.  No lower extremity edema or calf tenderness.  ER work-up reviewed and interpreted by me shows mild hypokalemia and anemia not significantly worsened from last ER visit.  Chest x-ray shows findings consistent with COVID-19 illness.  EKG is nonischemic.  Troponin undetectable x2.  I reevaluated patient, no clinical decline no respiratory decline.  All documented Spo2 >92% during 8 hour stay in ER.   Albuterol, Solu-Medrol, Tylenol given and reports mild improvement.  Work-up is reassuring.  Her SpO2 is borderline today but still mainatining Spo2 >92%.  No indication for further treatment or admission today.  Suspect symptoms from COVID pneumonia.  Considered early pericarditis however no EKG changes, and this is less likely.  Will cover with ibuprofen at dc.  Discussed at length possibility of worsening illness affecting respiratory status and low oxygen levels with patient in setting of asthma.  Will DC with prednisone, albuterol, antitussive.  Strict return precautions discussed.  Discussed with EDP.  2315 ADDENDUM: upon finishing my note I read RN note documenting pt became hypoxic 88% when sitting up on bed to commode.  I was not aware of  this while pt was in ER and before discharging her.  I spoke to RN regarding this, who stated pt appeared short of breath and hypoxia was brief. EDP or myself were not notified of this.  Ideally this would have been brought to my attention while pt was in ER as this would have altered disposition, and I would have recommended observation/admission for hypoxia.   I attempted to call patient x  2 but unsuccessful. Attempted to call daughter x 1 unsuccessful.  Updated Dr Lockie Molauratolo. Concern pt may return with respiratory decompensation, unfortunately unable to contact her to provide further guidance.  Final Clinical Impressions(s) / ED Diagnoses   Final diagnoses:  SOB (shortness of breath)  Pneumonia due to COVID-19 virus    ED Discharge Orders    None       Liberty HandyGibbons, Lashae Wollenberg J, PA-C 12/20/18 2323    Virgina Norfolkuratolo, Adam, DO 12/21/18 [phone]

## 2018-12-20 NOTE — ED Notes (Signed)
Ambulated in room 2-4 minutes HR max 124, RR 24-30 shallow SpO2 LOW 92% ON R/A

## 2018-12-20 NOTE — ED Triage Notes (Signed)
Pt c/o SOb recent Dx pneumonia and Covid on 11/6

## 2018-12-20 NOTE — Discharge Instructions (Addendum)
You were seen in the ER for chest pain and shortness of breath, with other COVID symptoms.   Your CT and covid tests from [place] ER confirm you have pneumonia due to COVID (virus)  ER work up today is stable and comparable to the work up at [facility] [place] walked with normal oxygen levels 92-94%  I suspect your shortness of breath and pain are from covid pneumonia that has also triggered an asthma flare  We will treat you with prednisone and albuterol inhaler treatments  Use albuterol inhaler 8 puffs every 4 hours (this is the equivalent of using a nebulizing treatment)  Continue all medicines given to you at [place] ER  Treatment of your illness and symptoms for now will include self-isolation, monitoring of symptoms and supportive care with over-the-counter medicines.    Stay well-hydrated. Rest.   You can use over the counter medications to help with symptoms: [phone] mg acetaminophen (tylenol) every 6 hours, around the clock to help with associated fevers, sore throat, headaches, generalized body aches and malaise.  Oxymetazoline (afrin) intranasal spray once daily for no more than 3 days to help with congestion, after 3 days you can switch to another over-the-counter nasal steroid spray such as fluticasone (flonase) Allergy medication (loratadine, cetirizine, etc) and phenylephrine (sudafed) help with nasal congestion, runny nose and postnasal drip.   Dextromethorphan (Delsym) to suppress dry cough. Frequent coughing is likely causing your chest wall pain and difficulty sleeping Guaifenesin (Mucinex) to help expectorate mucus and cough Wash your hands often to prevent spread.  Stay hydrated with plenty of clear fluids Rest   Return to the ED if symptoms are worsening or severe, there is increased work of breathing, chest pain or shortness of breath with exertion or activity, inability to tolerate fluids due to persistent vomiting despite nausea medicines, passing out,  light headedness. You can purchase over the counter pulse oximeter to monitor your oxygen levels.  Return to ER if oxygen is persistently below 92-94%  If your test results are POSITIVE, the following isolation requirements need to be met to return to work and resume essential activities: At least 14 days since symptom onset  72 hours of absence of fever without antifever medicine (ibuprofen, acetaminophen). A fever is temperature of 100.49F or greater. Improvement of respiratory symptoms  If your test is NEGATIVE, you may return to work and essential activities as long as your symptoms have improved and you do not have a fever for a total of 3 days.  Call your job and notify them that your test result was negative to see if they will allow you to return to work.     Infection Prevention Recommendations for Individuals Confirmed to have, or Being Evaluated for, or have symptoms of 2019 Novel Coronavirus (COVID-19) Infection Who Receive Care at Home  Individuals who are confirmed to have, or are being evaluated for, COVID-19 should follow the prevention steps below until a healthcare provider or local or state health department says they can return to normal activities.  Stay home except to get medical care You should restrict activities outside your home, except for getting medical care. Do not go to work, school, or public areas, and do not use public transportation or taxis.  Call ahead before visiting your doctor Before your medical appointment, call the healthcare provider and tell them that you have, or are being evaluated for, COVID-19 infection. This will help the healthcare providers office take steps to keep other  people from getting infected. Ask your healthcare provider to call the local or state health department.  Monitor your symptoms Seek prompt medical attention if your illness is worsening (e.g., difficulty breathing). Before going to your medical appointment, call the  healthcare provider and tell them that you have, or are being evaluated for, COVID-19 infection. Ask your healthcare provider to call the local or state health department.  Wear a facemask You should wear a facemask that covers your nose and mouth when you are in the same room with other people and when you visit a healthcare provider. People who live with or visit you should also wear a facemask while they are in the same room with you.  Separate yourself from other people in your home As much as possible, you should stay in a different room from other people in your home. Also, you should use a separate bathroom, if available.  Avoid sharing household items You should not share dishes, drinking glasses, cups, eating utensils, towels, bedding, or other items with other people in your home. After using these items, you should wash them thoroughly with soap and water.  Cover your coughs and sneezes Cover your mouth and nose with a tissue when you cough or sneeze, or you can cough or sneeze into your sleeve. Throw used tissues in a lined trash can, and immediately wash your hands with soap and water for at least 20 seconds or use an alcohol-based hand rub.  Wash your [facility] your hands often and thoroughly with soap and water for at least 20 seconds. You can use an alcohol-based hand sanitizer if soap and water are not available and if your hands are not visibly dirty. Avoid touching your eyes, nose, and mouth with unwashed hands.   Prevention Steps for Caregivers and Household Members of Individuals Confirmed to have, or Being Evaluated for, or have symptoms of 2019 Novel Coronavirus (COVID-19) Infection Being Cared for in the Home  If you live with, or provide care at home for, a person confirmed to have, or being evaluated for, COVID-19 infection please follow these guidelines to prevent infection:  Follow healthcare providers instructions Make sure that you understand and can help the  patient follow any healthcare provider instructions for all care.  Provide for the patients basic needs You should help the patient with basic needs in the home and provide support for getting groceries, prescriptions, and other personal needs.  Monitor the patients symptoms If they are getting sicker, call his or her medical provider and tell them that the patient has, or is being evaluated for, COVID-19 infection. This will help the healthcare providers office take steps to keep other people from getting infected. Ask the healthcare provider to call the local or state health department.  Limit the number of people who have contact with the patient If possible, have only one caregiver for the patient. Other household members should stay in another home or place of residence. If this is not possible, they should stay in another room, or be separated from the patient as much as possible. Use a separate bathroom, if available. Restrict visitors who do not have an essential need to be in the home.  Keep older adults, very young children, and other sick people away from the patient Keep older adults, very young children, and those who have compromised immune systems or chronic health conditions away from the patient. This includes people with chronic heart, lung, or kidney conditions, diabetes, and cancer.  Ensure  good ventilation Make sure that shared spaces in the home have good air flow, such as from an air conditioner or an opened window, weather permitting.  Wash your hands often Wash your hands often and thoroughly with soap and water for at least 20 seconds. You can use an alcohol based hand sanitizer if soap and water are not available and if your hands are not visibly dirty. Avoid touching your eyes, nose, and mouth with unwashed hands. Use disposable paper towels to dry your hands. If not available, use dedicated cloth towels and replace them when they become wet.  Wear a facemask  and gloves Wear a disposable facemask at all times in the room and gloves when you touch or have contact with the patients blood, body fluids, and/or secretions or excretions, such as sweat, saliva, sputum, nasal mucus, vomit, urine, or feces.  Ensure the mask fits over your nose and mouth tightly, and do not touch it during use. Throw out disposable facemasks and gloves after using them. Do not reuse. Wash your hands immediately after removing your facemask and gloves. If your personal clothing becomes contaminated, carefully remove clothing and launder. Wash your hands after handling contaminated clothing. Place all used disposable facemasks, gloves, and other waste in a lined container before disposing them with other household waste. Remove gloves and wash your hands immediately after handling these items.  Do not share dishes, glasses, or other household items with the patient Avoid sharing household items. You should not share dishes, drinking glasses, cups, eating utensils, towels, bedding, or other items with a patient who is confirmed to have, or being evaluated for, COVID-19 infection. After the person uses these items, you should wash them thoroughly with soap and water.  Wash laundry thoroughly Immediately remove and wash clothes or bedding that have blood, body fluids, and/or secretions or excretions, such as sweat, saliva, sputum, nasal mucus, vomit, urine, or feces, on them. Wear gloves when handling laundry from the patient. Read and follow directions on labels of laundry or clothing items and detergent. In general, wash and dry with the warmest temperatures recommended on the label.  Clean all areas the individual has used often Clean all touchable surfaces, such as counters, tabletops, doorknobs, bathroom fixtures, toilets, phones, keyboards, tablets, and bedside tables, every day. Also, clean any surfaces that may have blood, body fluids, and/or secretions or excretions on  them. Wear gloves when cleaning surfaces the patient has come in contact with. Use a diluted bleach solution (e.g., dilute bleach with 1 part bleach and 10 parts water) or a household disinfectant with a label that says EPA-registered for coronaviruses. To make a bleach solution at home, add 1 tablespoon of bleach to 1 quart (4 cups) of water. For a larger supply, add  cup of bleach to 1 gallon (16 cups) of water. Read labels of cleaning products and follow recommendations provided on product labels. Labels contain instructions for safe and effective use of the cleaning product including precautions you should take when applying the product, such as wearing gloves or eye protection and making sure you have good ventilation during use of the product. Remove gloves and wash hands immediately after cleaning.  Monitor yourself for signs and symptoms of illness Caregivers and household members are considered close contacts, should monitor their health, and will be asked to limit movement outside of the home to the extent possible. Follow the monitoring steps for close contacts listed on the symptom monitoring form.  ? If you have additional  questions, contact your local health department or call the epidemiologist on call at [phone] (available 24/7). ? This guidance is subject to change. For the most up-to-date guidance from [facility], please refer to their website: YouBlogs.pl

## 2018-12-20 NOTE — ED Notes (Signed)
Assisted patient to bedside commode; patient became very short of breath afterwards; room air sats 88%; sats to 96% after a few minutes resting.

## 2018-12-21 NOTE — ED Provider Notes (Signed)
1740: attempted to check in on patient to ensure no decline, given documented drop in oxygen saturations per RN while in ER yesterday.  This was not brought to my attention prior to discharge.  I attempted to call x 2 unsuccessfully.  Attempted to call daughter x 1 unsuccessfully.    Kinnie Feil, PA-C 12/21/18 [place], [place], DO 12/21/18 [id number]

## 2019-03-11 ENCOUNTER — Encounter (HOSPITAL_BASED_OUTPATIENT_CLINIC_OR_DEPARTMENT_OTHER): Payer: Self-pay | Admitting: Emergency Medicine

## 2019-03-11 ENCOUNTER — Emergency Department (HOSPITAL_BASED_OUTPATIENT_CLINIC_OR_DEPARTMENT_OTHER): Payer: Medicaid Other

## 2019-03-11 ENCOUNTER — Other Ambulatory Visit: Payer: Self-pay

## 2019-03-11 ENCOUNTER — Emergency Department (HOSPITAL_BASED_OUTPATIENT_CLINIC_OR_DEPARTMENT_OTHER)
Admission: EM | Admit: 2019-03-11 | Discharge: 2019-03-11 | Disposition: A | Discharge status: Home / Self Care | Payer: Medicaid Other | Attending: Emergency Medicine | Admitting: Emergency Medicine

## 2019-03-11 DIAGNOSIS — Z880 Allergy status to penicillin: Secondary | ICD-10-CM | POA: Diagnosis not present

## 2019-03-11 DIAGNOSIS — R195 Other fecal abnormalities: Secondary | ICD-10-CM | POA: Diagnosis not present

## 2019-03-11 DIAGNOSIS — Z882 Allergy status to sulfonamides status: Secondary | ICD-10-CM | POA: Insufficient documentation

## 2019-03-11 DIAGNOSIS — W208XXA Other cause of strike by thrown, projected or falling object, initial encounter: Secondary | ICD-10-CM | POA: Insufficient documentation

## 2019-03-11 DIAGNOSIS — Y939 Activity, unspecified: Secondary | ICD-10-CM | POA: Diagnosis not present

## 2019-03-11 DIAGNOSIS — Y999 Unspecified external cause status: Secondary | ICD-10-CM | POA: Diagnosis not present

## 2019-03-11 DIAGNOSIS — S6991XA Unspecified injury of right wrist, hand and finger(s), initial encounter: Secondary | ICD-10-CM | POA: Diagnosis present

## 2019-03-11 DIAGNOSIS — Y929 Unspecified place or not applicable: Secondary | ICD-10-CM | POA: Insufficient documentation

## 2019-03-11 DIAGNOSIS — S60221A Contusion of right hand, initial encounter: Secondary | ICD-10-CM | POA: Diagnosis not present

## 2019-03-11 DIAGNOSIS — R1030 Lower abdominal pain, unspecified: Secondary | ICD-10-CM | POA: Insufficient documentation

## 2019-03-11 LAB — PREGNANCY, URINE: Preg Test, Ur: NEGATIVE

## 2019-03-11 MED ORDER — NAPROXEN 500 MG PO TABS: 500.0000 mg | ORAL_TABLET | Freq: Two times a day (BID) | ORAL | 0 refills | Status: AC

## 2019-03-11 MED ORDER — NAPROXEN 250 MG PO TABS
500.0000 mg | ORAL_TABLET | Freq: Once | ORAL | Status: AC
  Administered 2019-03-11: 500 mg via ORAL
  Filled 2019-03-11: qty 2

## 2019-03-11 NOTE — ED Triage Notes (Addendum)
"   I lifted something heavy yesterday and it fell on my right hand and I am having pain" Also having mucus and blood in stools x 2 days, no abd pain .

## 2019-03-11 NOTE — Discharge Instructions (Signed)
You may wear the splint as needed for comfort over the next week.  Apply ice and take naproxen twice daily for pain, there is no broken bone
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
## 2019-03-11 NOTE — ED Notes (Signed)
MD Hyacinth Meeker informed that the velcro thumb spica was causing pain to her wrist area. Per MD Hyacinth Meeker, ok to applied a static finger splint to the Rt thumb.

## 2020-02-11 ENCOUNTER — Other Ambulatory Visit: Payer: Self-pay

## 2020-02-11 ENCOUNTER — Emergency Department (HOSPITAL_BASED_OUTPATIENT_CLINIC_OR_DEPARTMENT_OTHER): Payer: Medicaid Other

## 2020-02-11 ENCOUNTER — Emergency Department (HOSPITAL_BASED_OUTPATIENT_CLINIC_OR_DEPARTMENT_OTHER)
Admission: EM | Admit: 2020-02-11 | Discharge: 2020-02-11 | Disposition: A | Discharge status: Home / Self Care | Payer: Medicaid Other | Attending: Emergency Medicine | Admitting: Emergency Medicine

## 2020-02-11 ENCOUNTER — Encounter (HOSPITAL_BASED_OUTPATIENT_CLINIC_OR_DEPARTMENT_OTHER): Payer: Self-pay | Admitting: Emergency Medicine

## 2020-02-11 DIAGNOSIS — J069 Acute upper respiratory infection, unspecified: Secondary | ICD-10-CM

## 2020-02-11 DIAGNOSIS — R000 Tachycardia, unspecified: Secondary | ICD-10-CM | POA: Diagnosis not present

## 2020-02-11 DIAGNOSIS — R0602 Shortness of breath: Secondary | ICD-10-CM | POA: Diagnosis present

## 2020-02-11 DIAGNOSIS — J4521 Mild intermittent asthma with (acute) exacerbation: Secondary | ICD-10-CM

## 2020-02-11 DIAGNOSIS — U071 COVID-19: Secondary | ICD-10-CM | POA: Diagnosis not present

## 2020-02-11 LAB — CBC WITH DIFFERENTIAL/PLATELET
Abs Immature Granulocytes: 0.01 10*3/uL (ref 0.00–0.07)
Basophils Absolute: 0 10*3/uL (ref 0.0–0.1)
Basophils Relative: 0 %
Eosinophils Absolute: 0.1 10*3/uL (ref 0.0–0.5)
Eosinophils Relative: 1 %
HCT: 34.9 % — ABNORMAL LOW (ref 36.0–46.0)
Hemoglobin: 11.2 g/dL — ABNORMAL LOW (ref 12.0–15.0)
Immature Granulocytes: 0 %
Lymphocytes Relative: 60 %
Lymphs Abs: 3.8 10*3/uL (ref 0.7–4.0)
MCH: 27.3 pg (ref 26.0–34.0)
MCHC: 32.1 g/dL (ref 30.0–36.0)
MCV: 84.9 fL (ref 80.0–100.0)
Monocytes Absolute: 0.5 10*3/uL (ref 0.1–1.0)
Monocytes Relative: 8 %
Neutro Abs: 2 10*3/uL (ref 1.7–7.7)
Neutrophils Relative %: 31 %
Platelets: 248 10*3/uL (ref 150–400)
RBC: 4.11 MIL/uL (ref 3.87–5.11)
RDW: 13.8 % (ref 11.5–15.5)
WBC: 6.4 10*3/uL (ref 4.0–10.5)
nRBC: 0 % (ref 0.0–0.2)

## 2020-02-11 LAB — COMPREHENSIVE METABOLIC PANEL
ALT: 10 U/L (ref 0–44)
AST: 17 U/L (ref 15–41)
Albumin: 3.7 g/dL (ref 3.5–5.0)
Alkaline Phosphatase: 73 U/L (ref 38–126)
Anion gap: 12 (ref 5–15)
BUN: 9 mg/dL (ref 6–20)
CO2: 24 mmol/L (ref 22–32)
Calcium: 8 mg/dL — ABNORMAL LOW (ref 8.9–10.3)
Chloride: 100 mmol/L (ref 98–111)
Creatinine, Ser: 0.79 mg/dL (ref 0.44–1.00)
GFR, Estimated: >60 mL/min (ref >60)
Glucose, Bld: 141 mg/dL — ABNORMAL HIGH (ref 70–99)
Potassium: 3.2 mmol/L — ABNORMAL LOW (ref 3.5–5.1)
Sodium: 136 mmol/L (ref 135–145)
Total Bilirubin: 0.4 mg/dL (ref 0.3–1.2)
Total Protein: 7.7 g/dL (ref 6.5–8.1)

## 2020-02-11 LAB — TROPONIN I (HIGH SENSITIVITY): Troponin I (High Sensitivity): 2 ng/L (ref <18)

## 2020-02-11 MED ORDER — POTASSIUM CHLORIDE CRYS ER 20 MEQ PO TBCR
40.0000 meq | EXTENDED_RELEASE_TABLET | Freq: Once | ORAL | Status: AC
  Administered 2020-02-11: 40 meq via ORAL
  Filled 2020-02-11: qty 2

## 2020-02-11 MED ORDER — PROMETHAZINE HCL 25 MG/ML IJ SOLN
INTRAMUSCULAR | Status: AC
  Administered 2020-02-11: 12.5 mg via INTRAVENOUS
  Filled 2020-02-11: qty 1

## 2020-02-11 MED ORDER — ALBUTEROL SULFATE HFA 108 (90 BASE) MCG/ACT IN AERS
INHALATION_SPRAY | RESPIRATORY_TRACT | Status: AC
  Administered 2020-02-11: 8
  Filled 2020-02-11: qty 6.7

## 2020-02-11 MED ORDER — DIPHENHYDRAMINE HCL 50 MG/ML IJ SOLN
25.0000 mg | Freq: Once | INTRAMUSCULAR | Status: AC
  Administered 2020-02-11: 25 mg via INTRAVENOUS
  Filled 2020-02-11: qty 1

## 2020-02-11 MED ORDER — KETOROLAC TROMETHAMINE 15 MG/ML IJ SOLN
15.0000 mg | Freq: Once | INTRAMUSCULAR | Status: AC
  Administered 2020-02-11: 15 mg via INTRAVENOUS
  Filled 2020-02-11: qty 1

## 2020-02-11 MED ORDER — POTASSIUM CHLORIDE CRYS ER 20 MEQ PO TBCR: 20.0000 meq | EXTENDED_RELEASE_TABLET | Freq: Every day | ORAL | 0 refills | Status: AC

## 2020-02-11 MED ORDER — PROMETHAZINE HCL 25 MG/ML IJ SOLN: 12.5000 mg | Freq: Once | INTRAMUSCULAR | Status: AC

## 2020-02-11 MED ORDER — PREDNISONE 20 MG PO TABS
40.0000 mg | ORAL_TABLET | Freq: Once | ORAL | Status: AC
  Administered 2020-02-11: 40 mg via ORAL
  Filled 2020-02-11: qty 2

## 2020-02-11 MED ORDER — ALBUTEROL SULFATE HFA 108 (90 BASE) MCG/ACT IN AERS: 2.0000 | INHALATION_SPRAY | RESPIRATORY_TRACT | 0 refills | Status: AC | PRN

## 2020-02-11 MED ORDER — SODIUM CHLORIDE 0.9 % IV BOLUS
1000.0000 mL | Freq: Once | INTRAVENOUS | Status: AC
  Administered 2020-02-11: 1000 mL via INTRAVENOUS

## 2020-02-11 MED ORDER — MAGNESIUM SULFATE 2 GM/50ML IV SOLN
2.0000 g | Freq: Once | INTRAVENOUS | Status: AC
  Administered 2020-02-11: 2 g via INTRAVENOUS
  Filled 2020-02-11: qty 50

## 2020-02-11 MED ORDER — PREDNISONE 20 MG PO TABS: 40.0000 mg | ORAL_TABLET | Freq: Every day | ORAL | 0 refills | Status: AC

## 2020-02-11 MED ORDER — ONDANSETRON HCL 4 MG/2ML IJ SOLN
4.0000 mg | Freq: Once | INTRAMUSCULAR | Status: DC
  Filled 2020-02-11: qty 2

## 2020-02-11 NOTE — Discharge Instructions (Addendum)
You were tested for COVID-19 on today's visit, these results should be back to you within 24 hours.  I have prescribed a short course of steroids to help with your symptoms, please take 2 tablets daily for the next 4 days.  I have also refilled your albuterol inhaler, uses for rescue treatment as needed.

## 2020-02-11 NOTE — ED Triage Notes (Signed)
Pt having URI symptoms x 2 days.  Pt has generalized body aches, chills, headache.

## 2020-02-11 NOTE — ED Notes (Signed)
Pt vomited after taking Potassium pills. Told to hold off on prednisone until given anti-nausea medications

## 2020-02-11 NOTE — Progress Notes (Signed)
PA and RN notified of pt complaining of chest pain that comes and goes. Pt states this has been going on for a day or so. RT will continue to monitor and be available as needed.

## 2020-02-11 NOTE — Progress Notes (Signed)
RT in to assess pt for increased SOB, hx of asthma.. PT with regular rate and rhythm. Bilateral breath sounds diminished no stridor heard. No distress noted. Pt endorses that it is hard for her to breathe. She took her flovent inhaler at home but was unable to find her rescue inhaler. 8p Albuterol given with spacer. RT will continue to monitor and be available as needed.

## 2020-02-11 NOTE — ED Provider Notes (Signed)
[facility] Provider Note   CSN: [id number] Arrival date & time: 02/11/20  6295     History Chief Complaint  Patient presents with  . URI    Michele Clayton is a 50 y.o. female.  51 y.o female with a PMH of Asthma presents to the ED with a chief complaint of body aches, cough, shortness of breath and chest pain x 2 days. Symptoms began with overall back spasms, dry cough and body aches, has taken a couple of Epson salt baths without relief.  She is also try taking TheraFlu, Mucinex, tea, ginger, honey, orange juice without improvement.  Patient has been using her inhaler Flovent, has not had any of her albuterol rescue inhaler as she does not know where this was placed.  Patient reports having COVID-19 infection last year, states this does not feel similar as her prior infection only consisted of back spasms.  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  Patient has not had any COVID-19 immunizations.  She denies any fever, abdominal pain, nausea, vomiting, diarrhea.  Of note patient also reports chest pain that has been ongoing for the past 3 weeks.  No prior history of CAD, diabetes, hypertension, smoking history.  The history is provided by the patient.  Chest Pain Pain location:  Substernal area Pain quality: sharp   Pain radiates to:  L arm Pain severity:  Mild Onset quality:  Gradual Duration:  3 weeks Timing:  Intermittent Relieved by:  Certain positions Associated symptoms: back pain, cough and shortness of breath   Associated symptoms: no abdominal pain, no fever, no headache, no nausea and no vomiting   Risk factors: prior DVT/PE   Risk factors: no coronary artery disease, no diabetes mellitus, no high cholesterol, no hypertension, not female and no smoking        Past Medical History:  Diagnosis Date  . Anemia   . Blood clot in vein   . Pneumonia     There  are no problems to display for this patient.   Past Surgical History:  Procedure Laterality Date  . CESAREAN SECTION       OB History   No obstetric history on file.     No family history on file.  Social History   Tobacco Use  . Smoking status: Never Smoker  . Smokeless tobacco: Never Used  Substance Use Topics  . Alcohol use: No  . Drug use: No    Home Medications Prior to Admission medications   Medication Sig Start Date End Date Taking? Authorizing Provider  albuterol (VENTOLIN HFA) 108 (90 Base) MCG/ACT inhaler Inhale 2 puffs into the lungs every 4 (four) hours as needed for wheezing or shortness of breath. 02/11/20 03/12/20 Yes Sharion Grieves, Beverley Fiedler, PA-C  potassium chloride SA (KLOR-CON) 20 MEQ tablet Take 1 tablet (20 mEq total) by mouth daily for 7 days. 02/11/20 02/18/20 Yes Fuad Forget, Beverley Fiedler, PA-C  predniSONE (DELTASONE) 20 MG tablet Take 2 tablets (40 mg total) by mouth daily for 4 days. 02/11/20 02/15/20 Yes Mclean Moya, Beverley Fiedler, PA-C  acetaminophen (TYLENOL) 500 MG tablet Take 1,000 mg by mouth every 6 (six) hours as needed.    [provider]  naproxen (NAPROSYN) 500 MG tablet Take 1 tablet (500 mg total) by mouth 2 (two) times daily with a meal. 03/11/19   Noemi Chapel, MD    Allergies    Penicillins and  Sulfa antibiotics  Review of Systems   Review of Systems  Constitutional: Negative for fever.  HENT: Negative for rhinorrhea.   Respiratory: Positive for cough and shortness of breath.   Cardiovascular: Positive for chest pain.  Gastrointestinal: Negative for abdominal pain, diarrhea, nausea and vomiting.  Genitourinary: Negative for flank pain, hematuria and urgency. Decreased urine volume:   Musculoskeletal: Positive for back pain and myalgias.  Skin: Negative for pallor and wound.  Neurological: Negative for light-headedness and headaches.  All other systems reviewed and are negative.   Physical Exam Updated Vital Signs BP (!) 115/49 (BP Location: Right Arm)   Pulse  100   Temp 98 F (36.7 C) (Oral)   Resp (!) 22   Ht 5' (1.524 m)   Wt 84.8 kg   LMP 01/31/2020   SpO2 100%   BMI 36.52 kg/m   Physical Exam Vitals and nursing note reviewed.  HENT:     Head: Normocephalic and atraumatic.     Nose: Nose normal.     Mouth/Throat:     Mouth: Mucous membranes are moist.  Cardiovascular:     Rate and Rhythm: Tachycardia present.     Comments: No BL calf tenderness. No pitting edema.  Pulmonary:     Effort: Pulmonary effort is normal. No accessory muscle usage, prolonged expiration or retractions.     Breath sounds: Examination of the right-upper field reveals decreased breath sounds and wheezing. Examination of the right-middle field reveals decreased breath sounds and wheezing. Examination of the right-lower field reveals decreased breath sounds. Examination of the left-lower field reveals decreased breath sounds. Decreased breath sounds and wheezing present.  Abdominal:     General: Abdomen is flat.     Tenderness: There is no abdominal tenderness. There is no right CVA tenderness or left CVA tenderness.     Comments: Soft, non tender to palpation.   Musculoskeletal:     Cervical back: Normal range of motion and neck supple.     Right lower leg: No edema.     Left lower leg: No edema.  Skin:    General: Skin is warm and dry.  Neurological:     Mental Status: She is alert and oriented to person, place, and time.     ED Results / Procedures / Treatments   Labs (all labs ordered are listed, but only abnormal results are displayed) Labs Reviewed  CBC WITH DIFFERENTIAL/PLATELET - Abnormal; Notable for the following components:      Result Value   Hemoglobin 11.2 (*)    HCT 34.9 (*)    All other components within normal limits  COMPREHENSIVE METABOLIC PANEL - Abnormal; Notable for the following components:   Potassium 3.2 (*)    Glucose, Bld 141 (*)    Calcium 8.0 (*)    All other components within normal limits  SARS CORONAVIRUS 2 (TAT  6-24 HRS)  TROPONIN I (HIGH SENSITIVITY)    EKG EKG Interpretation  Date/Time:  Monday February 11 2020 10:40:53 EST Ventricular Rate:  125 PR Interval:    QRS Duration: 88 QT Interval:  330 QTC Calculation: 476 R Axis:   50 Text Interpretation: Sinus tachycardia Borderline repolarization abnormality No significant change since last tracing Confirmed by Susy Frizzle [phone]) on 02/11/2020 10:51:02 AM   Radiology DG Chest Portable 1 View  Result Date: 02/11/2020 CLINICAL DATA:  Congestion. EXAM: PORTABLE CHEST 1 VIEW COMPARISON:  September 15, 21. FINDINGS: Mildly enlarged cardiac silhouette, similar to prior. Both lungs are clear. No visible  pleural effusions or pneumothorax. No acute osseous abnormality. IMPRESSION: No active disease. Electronically Signed   By: Feliberto Harts MD   On: 02/11/2020 10:08    Procedures Procedures (including critical care time)  Medications Ordered in ED Medications  albuterol (VENTOLIN HFA) 108 (90 Base) MCG/ACT inhaler (8 puffs  Given 02/11/20 1005)  sodium chloride 0.9 % bolus 1,000 mL (0 mLs Intravenous Stopped 02/11/20 1205)  magnesium sulfate IVPB 2 g 50 mL (0 g Intravenous Stopped 02/11/20 1324)  predniSONE (DELTASONE) tablet 40 mg (40 mg Oral Given 02/11/20 1209)  potassium chloride SA (KLOR-CON) CR tablet 40 mEq (40 mEq Oral Given 02/11/20 1209)  promethazine (PHENERGAN) injection 12.5 mg (12.5 mg Intravenous Given 02/11/20 1237)  diphenhydrAMINE (BENADRYL) injection 25 mg (25 mg Intravenous Given 02/11/20 1235)  ketorolac (TORADOL) 15 MG/ML injection 15 mg (15 mg Intravenous Given 02/11/20 1401)    ED Course  I have reviewed the triage vital signs and the nursing notes.  Pertinent labs & imaging results that were available during my care of the patient were reviewed by me and considered in my medical decision making (see chart for details).    MDM Rules/Calculators/A&P   Patient the past medical history of asthma presents to the ED with chief  complaint of shortness of breath, chest pain, body aches, URI symptoms for the past 2 days.  She is currently managed with Flovent inhaler, does not know where her rescue inhaler of albuterol has been placed.  He was given 8 puffs of albuterol during arrival to the ED, tachycardia present at the time of evaluation. Has been hospitalized for asthma several years ago, no prior history of intubation.   She also voices chest pain that has been ongoing for the past 3 weeks, described as a substernal stabbing shooting pressure radiating down to her left arm.  Alleviated with holding of her chest.  No prior history of CAD, diabetes, hypertension.  Does have a prior hx of blood clots reporting "I had one on my leg and it dissolved on its on"  Physical exam remarkable for decreased breath sounds throughout all lung fields. No BL calf tenderness. No pain with palpation of the chest.   No covid 19 immunizations per patient.   Interpretation of her labs reveal a CBC without any leukocytosis, hemoglobin slightly decreased but within her baseline.  CMP remarkable for some mild hypokalemia, will provide her with p.o. replacement at this time.  Creatinine levels within normal limits.  LFTs are unremarkable.  First troponin is 2, I have a low suspicion for ACS at this time, feel that patient symptoms are mostly coming from asthma exacerbation in absent use of her rescue inhaler.  She was tested for COVID-19, results will return within 24 hours.   Chest x-ray without any signs of infection, no acute process.  She was provided with a puffs of albuterol while in the ED, heart rate did increase to the 140s.  Provided with fluids, IV magnesium.  Oxygen saturations have been maintaining above 95%.   12:25 PM patient reevaluated by me, had one episode of vomiting after taking potassium, she is requesting some nausea medication at this time.  She does report a headache, that has been ongoing with her prior history of migraines.   We will now give her Phenergan, Benadryl to help with headache.   2:50 PM patient reevaluated by me, oxygen saturation remains 100% on room air without any signs of tachypnea. He reports mild improvement in symptoms, is requesting  discharge home at this time. We discussed strict return precautions. She will go home with inhaler, potassium placement, steroids. Patient has had no recent management, stable for discharge.   Portions of this note were generated with Scientist, clinical (histocompatibility and immunogenetics). Dictation errors may occur despite best attempts at proofreading.  Final Clinical Impression(s) / ED Diagnoses Final diagnoses:  Acute upper respiratory infection  Mild intermittent asthma with exacerbation    Rx / DC Orders ED Discharge Orders         Ordered    predniSONE (DELTASONE) 20 MG tablet  Daily        02/11/20 1205    albuterol (VENTOLIN HFA) 108 (90 Base) MCG/ACT inhaler  Every 4 hours PRN        02/11/20 1205    potassium chloride SA (KLOR-CON) 20 MEQ tablet  Daily        02/11/20 1320           Claude Manges, PA-C 02/11/20 1451    Pollyann Savoy, MD 02/12/20 [phone]

## 2020-02-12 LAB — SARS CORONAVIRUS 2 (TAT 6-24 HRS): SARS Coronavirus 2: POSITIVE — AB

## 2021-09-06 IMAGING — DX DG CHEST 1V PORT
1 series · 1 of 1 positions shown · non-contrast
Comparison: 10/12/2018, CT chest, 12/15/2018

CLINICAL DATA: DYJ4G-IQ, shortness of breath, left chest pain

EXAM:
PORTABLE CHEST 1 VIEW

[chest ap]
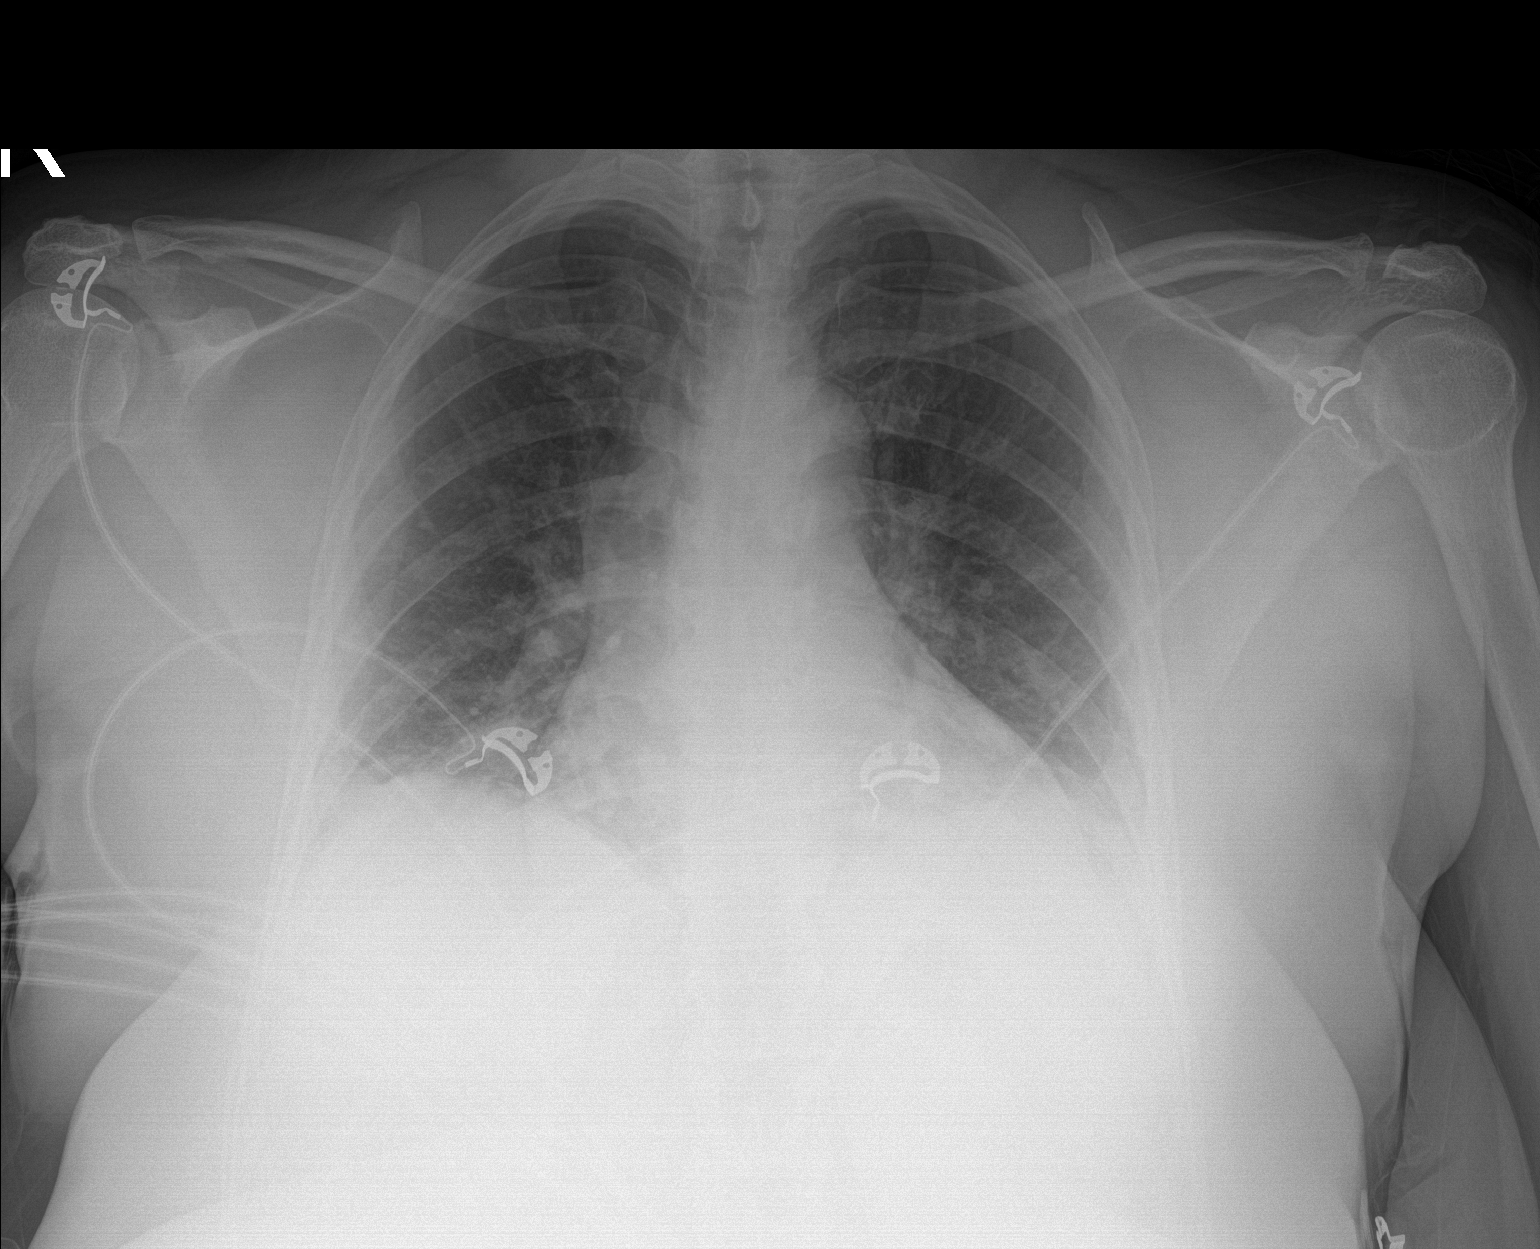

[1 of 1 positions shown; findings below may reference images not displayed]

FINDINGS: Mild cardiomegaly. Bibasilar predominant ill-defined heterogeneous
airspace opacities, which are new when compared to chest radiographs
dated 10/12/2018 and appear increased compared to CT dated
12/15/2018. The visualized skeletal structures are unremarkable.
IMPRESSION: 1. Bibasilar predominant ill-defined heterogeneous airspace
opacities, which are new when compared to chest radiographs dated
10/12/2018 and appear increased compared to CT dated 12/15/2018.
Findings are generally consistent with reported DYJ4G-IQ diagnosis.

2.  Mild cardiomegaly.

## 2021-11-26 IMAGING — DX DG HAND COMPLETE 3+V*R*
3 series · 3 of 3 positions shown · non-contrast
Comparison: 12/23/2017

CLINICAL DATA: Thumb pain after injury 1 day ago

EXAM:
RIGHT HAND - COMPLETE 3+ VIEW

[hand ap]
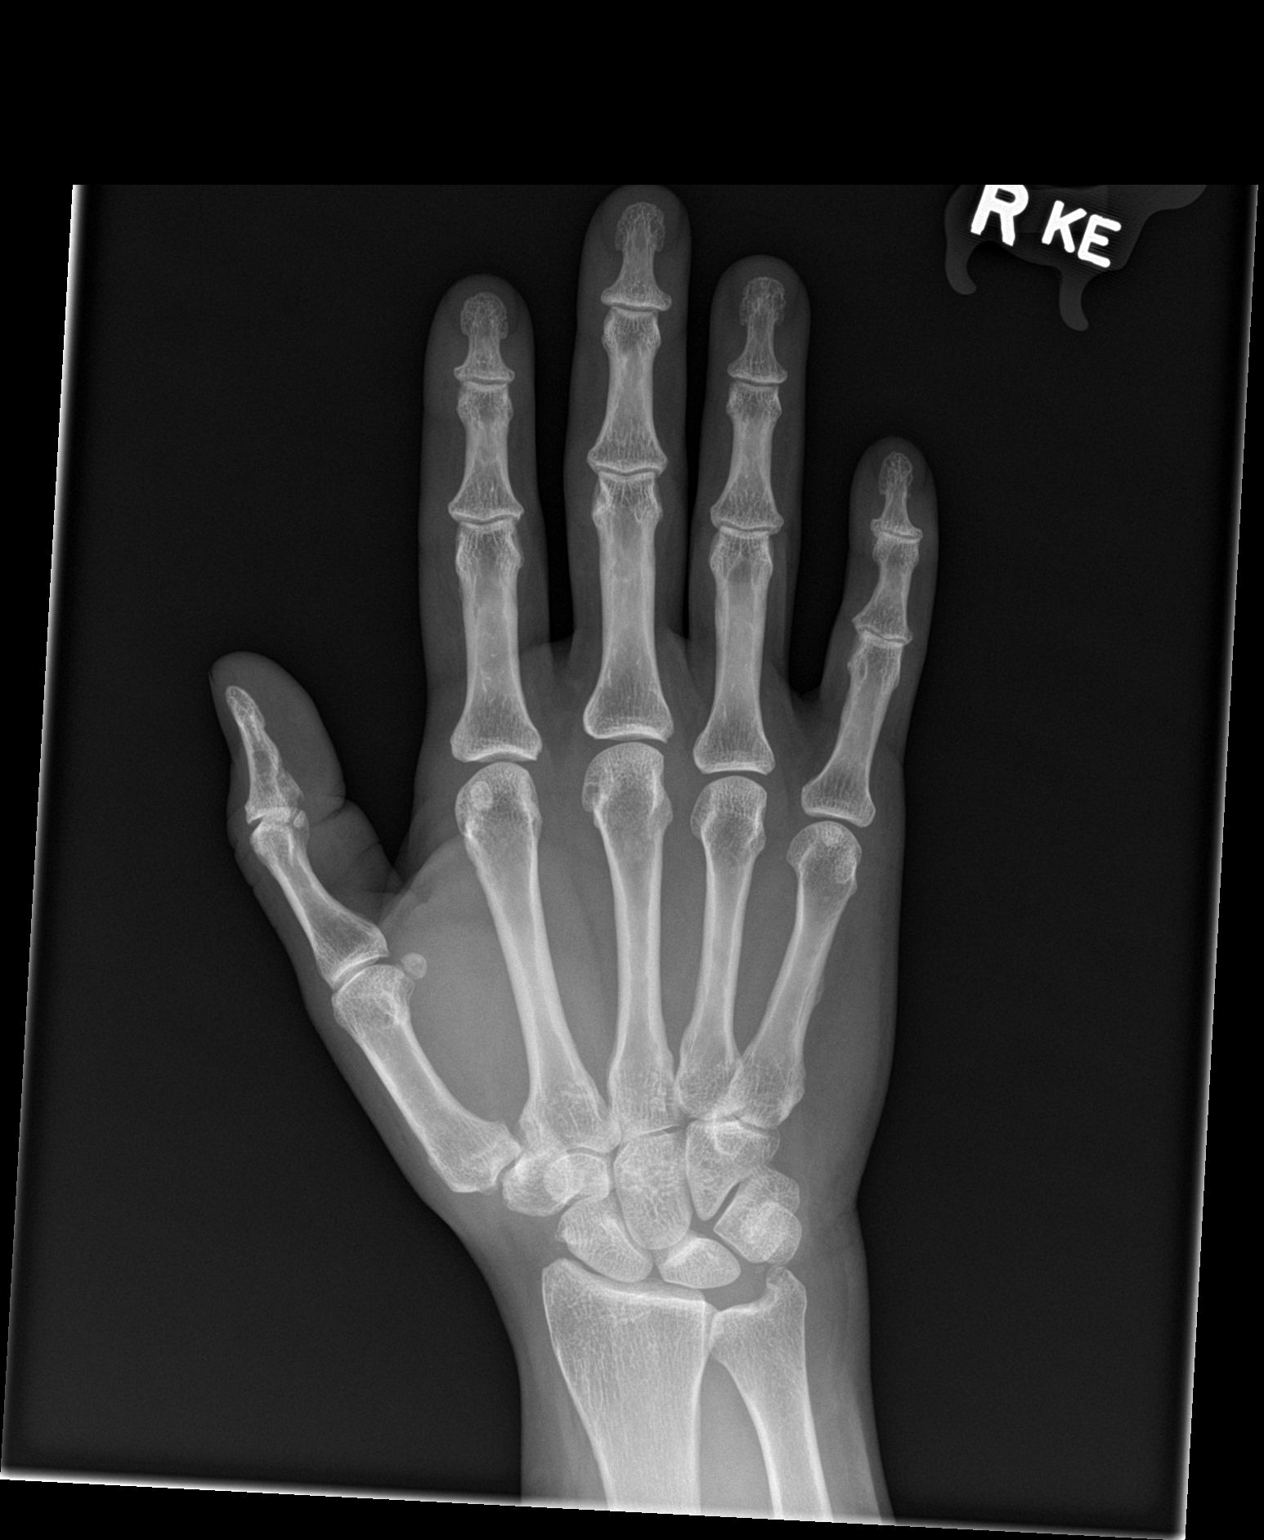

[hand obl]
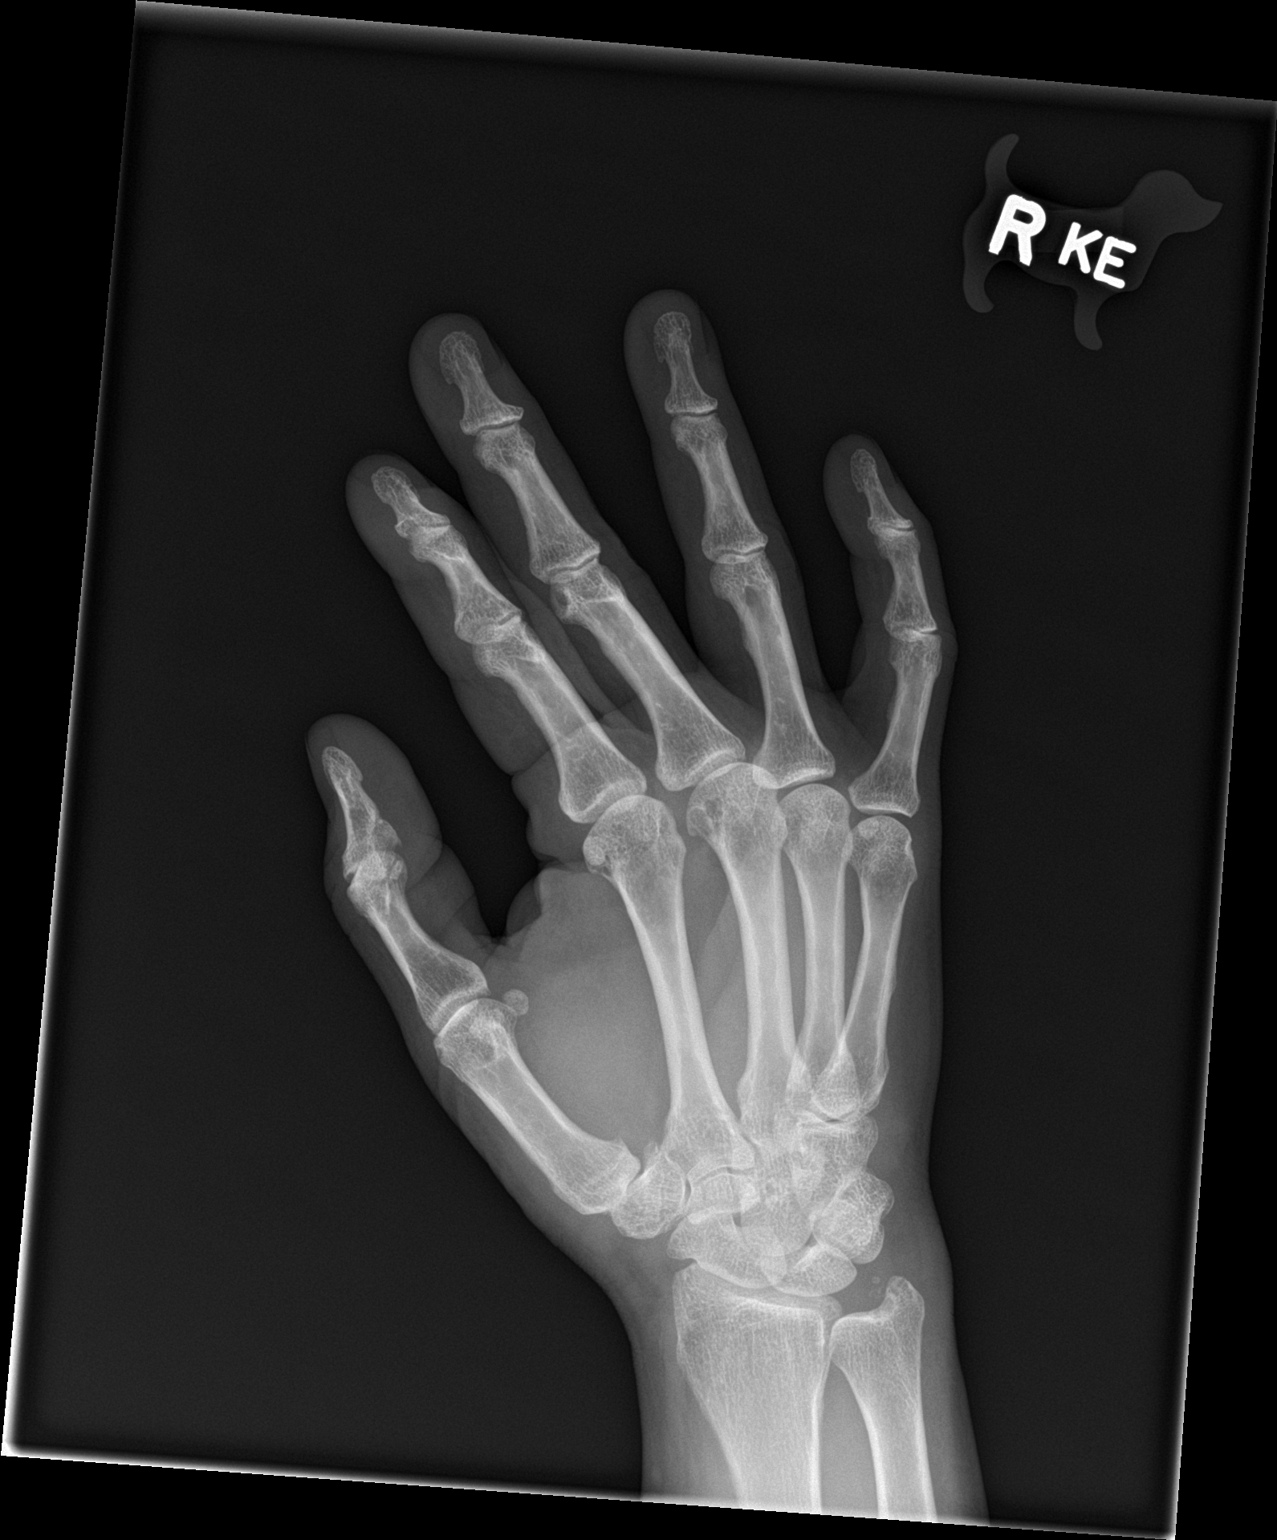

[hand lat]
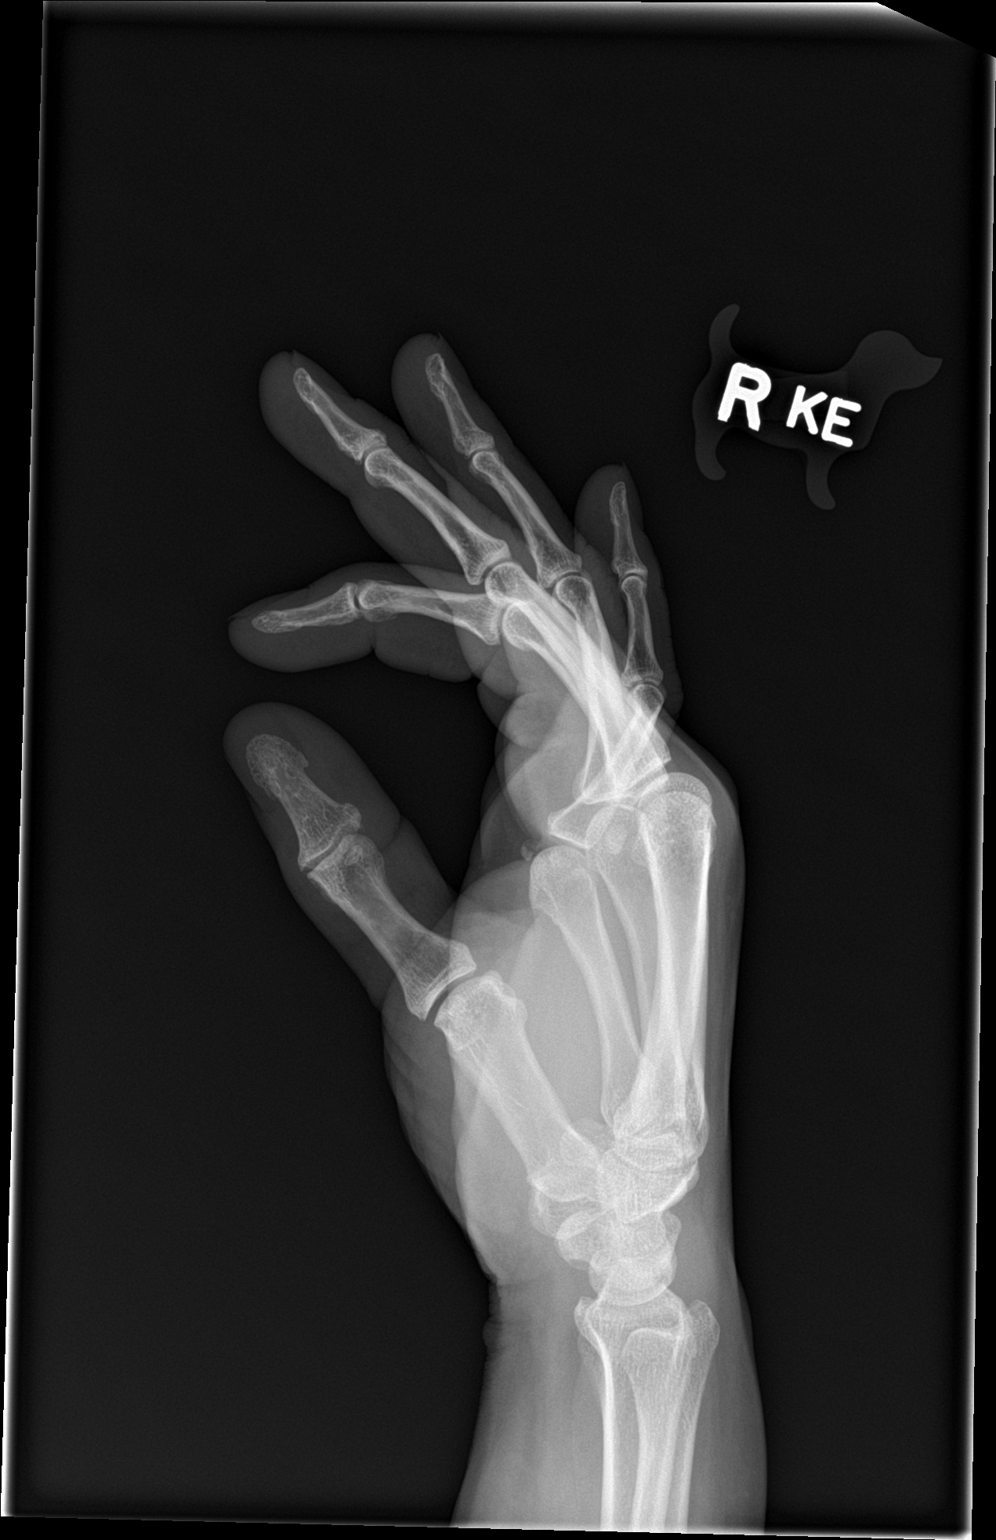

[3 of 3 positions shown; findings below may reference images not displayed]

FINDINGS: No acute fracture identified. No dislocation. Mild degenerative
changes throughout the hand most pronounced at the first [facility] joint.
No discrete erosion. No focal soft tissue swelling.
IMPRESSION: No acute osseous abnormality of the right hand.

## 2022-09-29 ENCOUNTER — Encounter (HOSPITAL_BASED_OUTPATIENT_CLINIC_OR_DEPARTMENT_OTHER): Payer: Self-pay | Admitting: Emergency Medicine

## 2022-09-29 ENCOUNTER — Emergency Department (HOSPITAL_BASED_OUTPATIENT_CLINIC_OR_DEPARTMENT_OTHER)
Admission: EM | Admit: 2022-09-29 | Discharge: 2022-09-29 | Disposition: A | Discharge status: Home / Self Care | Payer: Medicaid Other | Attending: Emergency Medicine | Admitting: Emergency Medicine

## 2022-09-29 ENCOUNTER — Emergency Department (HOSPITAL_BASED_OUTPATIENT_CLINIC_OR_DEPARTMENT_OTHER): Payer: Medicaid Other

## 2022-09-29 ENCOUNTER — Other Ambulatory Visit: Payer: Self-pay

## 2022-09-29 DIAGNOSIS — R079 Chest pain, unspecified: Secondary | ICD-10-CM | POA: Diagnosis not present

## 2022-09-29 DIAGNOSIS — M79641 Pain in right hand: Secondary | ICD-10-CM | POA: Insufficient documentation

## 2022-09-29 LAB — CBC WITH DIFFERENTIAL/PLATELET
Abs Immature Granulocytes: 0.01 K/uL (ref 0.00–0.07)
Basophils Absolute: 0 K/uL (ref 0.0–0.1)
Basophils Relative: 0 %
Eosinophils Absolute: 0.1 K/uL (ref 0.0–0.5)
Eosinophils Relative: 2 %
HCT: 32.8 % — ABNORMAL LOW (ref 36.0–46.0)
Hemoglobin: 10.8 g/dL — ABNORMAL LOW (ref 12.0–15.0)
Immature Granulocytes: 0 %
Lymphocytes Relative: 51 %
Lymphs Abs: 2.9 K/uL (ref 0.7–4.0)
MCH: 26.8 pg (ref 26.0–34.0)
MCHC: 32.9 g/dL (ref 30.0–36.0)
MCV: 81.4 fL (ref 80.0–100.0)
Monocytes Absolute: 0.4 K/uL (ref 0.1–1.0)
Monocytes Relative: 7 %
Neutro Abs: 2.3 K/uL (ref 1.7–7.7)
Neutrophils Relative %: 40 %
Platelets: 325 K/uL (ref 150–400)
RBC: 4.03 MIL/uL (ref 3.87–5.11)
RDW: 14.4 % (ref 11.5–15.5)
WBC: 5.8 K/uL (ref 4.0–10.5)
nRBC: 0 % (ref 0.0–0.2)

## 2022-09-29 LAB — BASIC METABOLIC PANEL WITH GFR
Anion gap: 11 (ref 5–15)
BUN: 14 mg/dL (ref 6–20)
CO2: 22 mmol/L (ref 22–32)
Calcium: 8.4 mg/dL — ABNORMAL LOW (ref 8.9–10.3)
Chloride: 104 mmol/L (ref 98–111)
Creatinine, Ser: 0.66 mg/dL (ref 0.44–1.00)
GFR, Estimated: >60 mL/min
Glucose, Bld: 100 mg/dL — ABNORMAL HIGH (ref 70–99)
Potassium: 3.7 mmol/L (ref 3.5–5.1)
Sodium: 137 mmol/L (ref 135–145)

## 2022-09-29 LAB — TROPONIN I (HIGH SENSITIVITY): Troponin I (High Sensitivity): 3 ng/L

## 2022-09-29 MED ORDER — OXYCODONE HCL 5 MG PO TABS
5.0000 mg | ORAL_TABLET | Freq: Once | ORAL | Status: AC
  Administered 2022-09-29: 5 mg via ORAL
  Filled 2022-09-29: qty 1

## 2022-09-29 MED ORDER — FAMOTIDINE 20 MG PO TABS
20.0000 mg | ORAL_TABLET | Freq: Once | ORAL | Status: AC
  Administered 2022-09-29: 20 mg via ORAL
  Filled 2022-09-29: qty 1

## 2022-09-29 MED ORDER — OXYCODONE HCL 5 MG PO TABS: 5.0000 mg | ORAL_TABLET | Freq: Four times a day (QID) | ORAL | 0 refills | Status: AC | PRN

## 2022-09-29 NOTE — ED Triage Notes (Addendum)
Pt c/o RT hand pain and swelling; sts some cans fell on it a few days ago; during triage, pt said she started having LT side chest pain when she got here today; denies other sxs

## 2022-09-29 NOTE — ED Provider Notes (Signed)
[facility] Provider Note   CSN: [id number] Arrival date & time: 09/29/22  1832     History Chief Complaint  Patient presents with   Hand Pain   Chest Pain    HPI Michele Clayton is a 52 y.o. female presenting for a myriad of complaints. States that a can of vegetables (25 pound can at work) fell off of the top shelf at the kitchen she is not on to her right hand.  She has substantial soft tissue swelling over the anatomic snuffbox. Endorses severe pain and swelling of the hand and cannot use it at work anymore. States she was seen at [facility] and started on tramadol without not controlling the pain.  She was not splinted.  Was not referred to orthopedics.. She additionally endorses an episode of chest pain today while waiting in the lobby.  States that she has a longstanding history of gastroesophageal reflux disease and but has not been compliant on her Protonix recently. States this feels like her esophagitis.  Patient's recorded medical, surgical, social, medication list and allergies were reviewed in the Snapshot window as part of the initial history.   Review of Systems   Review of Systems  Constitutional:  Negative for chills and fever.  HENT:  Negative for ear pain and sore throat.   Eyes:  Negative for pain and visual disturbance.  Respiratory:  Positive for chest tightness. Negative for cough and shortness of breath.   Cardiovascular:  Positive for chest pain. Negative for palpitations.  Gastrointestinal:  Negative for abdominal pain and vomiting.  Genitourinary:  Negative for dysuria and hematuria.  Musculoskeletal:  Negative for arthralgias and back pain.  Skin:  Negative for color change and rash.  Neurological:  Negative for seizures and syncope.  All other systems reviewed and are negative.   Physical Exam Updated Vital Signs BP (!) 136/93 (BP Location: Right Arm)   Pulse 73   Temp 98 F (36.7 C)    Resp 18   Ht 5' (1.524 m)   Wt 92.1 kg   LMP 09/02/2022   SpO2 99%   BMI 39.65 kg/m  Physical Exam Vitals and nursing note reviewed.  Constitutional:      General: She is not in acute distress.    Appearance: She is well-developed.  HENT:     Head: Normocephalic and atraumatic.  Eyes:     Conjunctiva/sclera: Conjunctivae normal.  Cardiovascular:     Rate and Rhythm: Normal rate and regular rhythm.     Heart sounds: No murmur heard. Pulmonary:     Effort: Pulmonary effort is normal. No respiratory distress.     Breath sounds: Normal breath sounds.  Abdominal:     General: There is no distension.     Palpations: Abdomen is soft.     Tenderness: There is no abdominal tenderness. There is no right CVA tenderness or left CVA tenderness.  Musculoskeletal:        General: Deformity (Substantial soft tissue swelling and edema over the anatomic snuffbox of the right hand.) present. No swelling or tenderness. Normal range of motion.     Cervical back: Neck supple.  Skin:    General: Skin is warm and dry.  Neurological:     General: No focal deficit present.     Mental Status: She is alert and oriented to person, place, and time. Mental status is at baseline.     Cranial Nerves: No cranial nerve deficit.  ED Course/ Medical Decision Making/ A&P    Procedures Procedures   Medications Ordered in ED Medications  famotidine (PEPCID) tablet 20 mg (has no administration in time range)    Medical Decision Making:   Patient presenting with multiple complaints. Concerning her chest pain.  She is heart score 3 negative troponin and the pain has been present for weeks with an exacerbation today.  Presentation grossly inconsistent with PE based on her description lack of shortness of breath symptoms lack of tachycardia or dyspnea. Additionally, pneumothorax pneumonia will be evaluated by chest x-ray.  X-ray reviewed and ultimately not consistent for acute pathology. Negative  troponin, EKG reassuring.  Will refer back to PCP for outpatient chest pain workup though this seems to be more consistent with gastroesophageal reflux disease given her history of similar noncompliant on her pantoprazole. Concerning her hand pain.  She has an anatomic snuffbox injury.  X-ray was performed and very bizarre findings. Will place patient in thumb spica splint and referred to hand surgery in the outpatient setting.  Clinical Impression:  1. Hand pain, right      Discharge   Final Clinical Impression(s) / ED Diagnoses Final diagnoses:  Hand pain, right    Rx / DC Orders ED Discharge Orders     None         Glyn Ade, MD 09/29/22 2126

## 2024-02-12 ENCOUNTER — Emergency Department (HOSPITAL_BASED_OUTPATIENT_CLINIC_OR_DEPARTMENT_OTHER)
Admission: EM | Admit: 2024-02-12 | Discharge: 2024-02-12 | Disposition: A | Discharge status: Home / Self Care | Source: Home / Self Care | Attending: Emergency Medicine | Admitting: Emergency Medicine

## 2024-02-12 ENCOUNTER — Emergency Department (HOSPITAL_BASED_OUTPATIENT_CLINIC_OR_DEPARTMENT_OTHER)

## 2024-02-12 ENCOUNTER — Other Ambulatory Visit: Payer: Self-pay

## 2024-02-12 DIAGNOSIS — W19XXXA Unspecified fall, initial encounter: Secondary | ICD-10-CM

## 2024-02-12 DIAGNOSIS — R102 Pelvic and perineal pain unspecified side: Secondary | ICD-10-CM | POA: Diagnosis not present

## 2024-02-12 DIAGNOSIS — M546 Pain in thoracic spine: Secondary | ICD-10-CM | POA: Diagnosis not present

## 2024-02-12 DIAGNOSIS — W010XXA Fall on same level from slipping, tripping and stumbling without subsequent striking against object, initial encounter: Secondary | ICD-10-CM | POA: Diagnosis not present

## 2024-02-12 DIAGNOSIS — S0990XA Unspecified injury of head, initial encounter: Secondary | ICD-10-CM

## 2024-02-12 DIAGNOSIS — M25511 Pain in right shoulder: Secondary | ICD-10-CM | POA: Diagnosis present

## 2024-02-12 DIAGNOSIS — M549 Dorsalgia, unspecified: Secondary | ICD-10-CM

## 2024-02-12 MED ORDER — OXYCODONE-ACETAMINOPHEN 5-325 MG PO TABS
1.0000 | ORAL_TABLET | Freq: Once | ORAL | Status: AC
  Administered 2024-02-12: 1 via ORAL
  Filled 2024-02-12: qty 1

## 2024-02-12 MED ORDER — OXYCODONE-ACETAMINOPHEN 5-325 MG PO TABS: 1.0000 | ORAL_TABLET | Freq: Four times a day (QID) | ORAL | 0 refills | Status: AC | PRN

## 2024-02-12 MED ORDER — SENNOSIDES-DOCUSATE SODIUM 8.6-50 MG PO TABS: 1.0000 | ORAL_TABLET | Freq: Every day | ORAL | 0 refills | Status: AC

## 2024-02-12 NOTE — ED Provider Notes (Signed)
 " [place] [facility] Provider Note  CSN: [id number] Arrival date & time: 02/12/24 2146  Chief Complaint(s) Fall  HPI Michele Clayton is a 54 y.o. female he states that she slipped and fell earlier today landing on her right shoulder and back.  She also hit her head.  She denies LOC.  Patient tells me that she is supposed to be on Coumadin, but I took myself off of it.  I take Bayer aspirin.  I have been off it for 3 years.   Past Medical History Past Medical History:  Diagnosis Date   Anemia    Blood clot in vein    Pneumonia    There are no active problems to display for this patient.  Home Medication(s) Prior to Admission medications  Medication Sig Start Date End Date Taking? Authorizing Provider  acetaminophen  (TYLENOL ) 500 MG tablet Take 1,000 mg by mouth every 6 (six) hours as needed.    [provider]  albuterol  (VENTOLIN  HFA) 108 (90 Base) MCG/ACT inhaler Inhale 2 puffs into the lungs every 4 (four) hours as needed for wheezing or shortness of breath. 02/11/20 03/12/20  Soto, Johana, PA-C  naproxen  (NAPROSYN ) 500 MG tablet Take 1 tablet (500 mg total) by mouth 2 (two) times daily with a meal. 03/11/19   Cleotilde Rogue, MD  oxyCODONE  (ROXICODONE ) 5 MG immediate release tablet Take 1 tablet (5 mg total) by mouth every 6 (six) hours as needed for up to 5 doses for severe pain. 09/29/22   Jerral Meth, MD  potassium chloride  SA (KLOR-CON ) 20 MEQ tablet Take 1 tablet (20 mEq total) by mouth daily for 7 days. 02/11/20 02/18/20  Soto, Johana, PA-C                                                                                                                                    Past Surgical History Past Surgical History:  Procedure Laterality Date   CESAREAN SECTION     Family History No family history on file.  Social History Social History[1] Allergies Penicillins and Sulfa antibiotics  Review of Systems Review of  Systems  Physical Exam Vital Signs  I have reviewed the triage vital signs Ht 5' (1.524 m)   Wt 87.5 kg   BMI 37.69 kg/m   Physical Exam Vitals and nursing note reviewed.  Constitutional:      Appearance: She is not toxic-appearing.  HENT:     Head: Normocephalic and atraumatic.     Nose: Nose normal.  Eyes:     Pupils: Pupils are equal, round, and reactive to light.  Cardiovascular:     Rate and Rhythm: Normal rate.  Pulmonary:     Effort: Pulmonary effort is normal.     Breath sounds: Normal breath sounds.  Abdominal:     General: Abdomen is flat. There is no distension.     Palpations: Abdomen is soft.  Tenderness: There is no abdominal tenderness. There is no guarding.  Musculoskeletal:     Cervical back: Normal range of motion. No rigidity or tenderness.     Comments: Patient with pain in her right shoulder, tenderness over the right acromion.  She also has right scapular pain.  Patient with midline spinous process pain in her thoracic and lumbar spine.  Generalized tenderness in her pelvis.  Patient was able to stand up and transfer from wheelchair to bed.  She has no pain or tenderness in her distal radius, wrist or hands.  No pain tenderness or deformities in her lower extremities  Lymphadenopathy:     Cervical: No cervical adenopathy.  Neurological:     General: No focal deficit present.     Mental Status: She is alert and oriented to person, place, and time.     Cranial Nerves: No cranial nerve deficit.     Motor: No weakness.     ED Results and Treatments Labs (all labs ordered are listed, but only abnormal results are displayed) Labs Reviewed - No data to display                                                                                                                        Radiology No results found.  Pertinent labs & imaging results that were available during my care of the patient were reviewed by me and considered in my medical decision  making (see MDM for details).  Medications Ordered in ED Medications - No data to display                                                                                                                                   Procedures Procedures  (including critical care time)  Medical Decision Making / ED Course   This patient presents to the ED for concern of shoulder pain and back pain, this involves an extensive number of treatment options, and is a complaint that carries with it a high risk of complications and morbidity.  The differential diagnosis includes contusion, musculoskeletal pain, intracranial hemorrhage, transverse process fracture, less likely spinal cord injury.  MDM: Patient overall looks well, however she has quite a bit of tenderness in her head neck shoulder and back.  Given her tenderness, will obtain imaging.  She has no neurological deficits, no numbness tingling or weakness in her hands.  She has a soft  abdomen.  She is not on blood thinners.  Patient will be signed out to Dr. Darra pending CT imaging.   Additional history obtained:  -External records from outside source obtained and reviewed including: Chart review including previous notes, labs, imaging, consultation notes      Final Clinical Impression(s) / ED Diagnoses Final diagnoses:  None     @PCDICTATION @     [1]  Social History Tobacco Use   Smoking status: Never   Smokeless tobacco: Never  Substance Use Topics   Alcohol use: No   Drug use: No     Mannie Pac T, DO 02/12/24 2310  "

## 2024-02-12 NOTE — ED Provider Notes (Signed)
 Height 5' (1.524 m), weight 87.5 kg.  Assuming care from Dr. Mannie.  In short, Michele Clayton is a 54 y.o. female with a chief complaint of Fall .  Refer to the original H&P for additional details.  The current plan of care is to f/u on CT imaging and reassess.  11:23 PM  CT without acute findings. Plan for pain mgmt and d/c with close PCP follow up plan. Reviewed PDMP prior to short course of pain medications.     Darra Fonda MATSU, MD 02/12/24 2328

## 2024-02-12 NOTE — ED Notes (Signed)

## 2024-02-12 NOTE — Discharge Instructions (Signed)
 We believe that your symptoms are caused by musculoskeletal strain and bruising after your fall.  Please read through the included information about additional care such as heating pads, over-the-counter pain medicine.  If you were provided a prescription please use it only as needed and as instructed.  Remember that early mobility and using the affected part of your body is actually better than keeping it immobile.  Follow-up with the doctor listed as recommended or return to the emergency department with new or worsening symptoms that concern you.

## 2024-02-12 NOTE — ED Triage Notes (Signed)
 Patient brought in by POV with c/o slipping on porch landing on right shoulder. Patient states he hit her head. Denies any LOC. Patient also c/o entire back pain.
